# Patient Record
Sex: Female | Born: 1955 | Race: White | Hispanic: No | Smoking: Current every day smoker
Health system: Southern US, Community
[De-identification: ages and names within clinical notes are randomized; demographics above are authoritative.]

## PROBLEM LIST (undated history)

## (undated) DIAGNOSIS — K219 Gastro-esophageal reflux disease without esophagitis: Secondary | ICD-10-CM

## (undated) DIAGNOSIS — I82409 Acute embolism and thrombosis of unspecified deep veins of unspecified lower extremity: Secondary | ICD-10-CM

## (undated) DIAGNOSIS — I10 Essential (primary) hypertension: Secondary | ICD-10-CM

## (undated) DIAGNOSIS — D649 Anemia, unspecified: Secondary | ICD-10-CM

## (undated) DIAGNOSIS — I959 Hypotension, unspecified: Secondary | ICD-10-CM

## (undated) DIAGNOSIS — F419 Anxiety disorder, unspecified: Secondary | ICD-10-CM

## (undated) DIAGNOSIS — M1990 Unspecified osteoarthritis, unspecified site: Secondary | ICD-10-CM

## (undated) DIAGNOSIS — R51 Headache: Secondary | ICD-10-CM

## (undated) DIAGNOSIS — Z862 Personal history of diseases of the blood and blood-forming organs and certain disorders involving the immune mechanism: Secondary | ICD-10-CM

## (undated) DIAGNOSIS — G4730 Sleep apnea, unspecified: Secondary | ICD-10-CM

## (undated) DIAGNOSIS — IMO0001 Reserved for inherently not codable concepts without codable children: Secondary | ICD-10-CM

## (undated) DIAGNOSIS — Z86711 Personal history of pulmonary embolism: Secondary | ICD-10-CM

## (undated) DIAGNOSIS — R42 Dizziness and giddiness: Secondary | ICD-10-CM

## (undated) DIAGNOSIS — K859 Acute pancreatitis without necrosis or infection, unspecified: Secondary | ICD-10-CM

## (undated) DIAGNOSIS — F32A Depression, unspecified: Secondary | ICD-10-CM

## (undated) DIAGNOSIS — F329 Major depressive disorder, single episode, unspecified: Secondary | ICD-10-CM

## (undated) DIAGNOSIS — E119 Type 2 diabetes mellitus without complications: Secondary | ICD-10-CM

## (undated) DIAGNOSIS — R519 Headache, unspecified: Secondary | ICD-10-CM

## (undated) DIAGNOSIS — M5030 Other cervical disc degeneration, unspecified cervical region: Secondary | ICD-10-CM

## (undated) HISTORY — PX: ABDOMINAL HYSTERECTOMY: SHX81

## (undated) HISTORY — PX: JOINT REPLACEMENT: SHX530

## (undated) HISTORY — PX: DILATION AND CURETTAGE OF UTERUS: SHX78

---

## 2003-03-29 DIAGNOSIS — Z86711 Personal history of pulmonary embolism: Secondary | ICD-10-CM

## 2003-03-29 HISTORY — DX: Personal history of pulmonary embolism: Z86.711

## 2003-06-04 ENCOUNTER — Other Ambulatory Visit: Payer: Self-pay

## 2004-01-20 ENCOUNTER — Ambulatory Visit: Payer: Self-pay | Admitting: Physician Assistant

## 2004-02-18 ENCOUNTER — Ambulatory Visit: Payer: Self-pay | Admitting: Physician Assistant

## 2004-03-23 ENCOUNTER — Ambulatory Visit: Payer: Self-pay | Admitting: Physician Assistant

## 2004-03-25 ENCOUNTER — Ambulatory Visit: Payer: Self-pay | Admitting: Pain Medicine

## 2004-03-26 ENCOUNTER — Ambulatory Visit: Payer: Self-pay | Admitting: Pain Medicine

## 2004-04-21 ENCOUNTER — Ambulatory Visit: Payer: Self-pay | Admitting: Physician Assistant

## 2004-06-01 ENCOUNTER — Ambulatory Visit: Payer: Self-pay | Admitting: Physician Assistant

## 2004-06-30 ENCOUNTER — Ambulatory Visit: Payer: Self-pay | Admitting: Physician Assistant

## 2004-08-02 ENCOUNTER — Ambulatory Visit: Payer: Self-pay | Admitting: Physician Assistant

## 2004-09-06 ENCOUNTER — Ambulatory Visit: Payer: Self-pay | Admitting: Physician Assistant

## 2004-10-04 ENCOUNTER — Ambulatory Visit: Payer: Self-pay | Admitting: Physician Assistant

## 2004-11-03 ENCOUNTER — Ambulatory Visit: Payer: Self-pay | Admitting: Physician Assistant

## 2004-12-02 ENCOUNTER — Ambulatory Visit: Payer: Self-pay | Admitting: Physician Assistant

## 2004-12-31 ENCOUNTER — Ambulatory Visit: Payer: Self-pay | Admitting: Physician Assistant

## 2005-01-30 ENCOUNTER — Emergency Department (HOSPITAL_COMMUNITY): Admission: EM | Admit: 2005-01-30 | Discharge: 2005-01-30 | Payer: Self-pay | Admitting: Emergency Medicine

## 2005-02-02 ENCOUNTER — Ambulatory Visit: Payer: Self-pay | Admitting: Physician Assistant

## 2005-02-09 ENCOUNTER — Ambulatory Visit: Payer: Self-pay | Admitting: Physician Assistant

## 2005-03-02 ENCOUNTER — Ambulatory Visit: Payer: Self-pay | Admitting: Physician Assistant

## 2005-04-08 ENCOUNTER — Ambulatory Visit: Payer: Self-pay | Admitting: Physician Assistant

## 2005-05-09 ENCOUNTER — Ambulatory Visit: Payer: Self-pay | Admitting: Physician Assistant

## 2005-05-27 ENCOUNTER — Ambulatory Visit: Payer: Self-pay | Admitting: Neurology

## 2005-06-13 ENCOUNTER — Ambulatory Visit: Payer: Self-pay | Admitting: Physician Assistant

## 2005-07-18 ENCOUNTER — Ambulatory Visit: Payer: Self-pay | Admitting: Physician Assistant

## 2005-07-28 ENCOUNTER — Ambulatory Visit: Payer: Self-pay | Admitting: Orthopaedic Surgery

## 2005-08-08 ENCOUNTER — Other Ambulatory Visit: Payer: Self-pay

## 2005-08-11 ENCOUNTER — Ambulatory Visit: Payer: Self-pay | Admitting: Physician Assistant

## 2005-08-12 ENCOUNTER — Ambulatory Visit: Payer: Self-pay | Admitting: Orthopaedic Surgery

## 2005-09-20 ENCOUNTER — Ambulatory Visit: Payer: Self-pay | Admitting: Physician Assistant

## 2005-10-18 ENCOUNTER — Ambulatory Visit: Payer: Self-pay | Admitting: Physician Assistant

## 2005-10-26 ENCOUNTER — Ambulatory Visit: Payer: Self-pay | Admitting: Pain Medicine

## 2005-10-27 ENCOUNTER — Ambulatory Visit: Payer: Self-pay | Admitting: Pain Medicine

## 2005-11-17 ENCOUNTER — Ambulatory Visit: Payer: Self-pay | Admitting: Physician Assistant

## 2005-12-15 ENCOUNTER — Ambulatory Visit: Payer: Self-pay | Admitting: Physician Assistant

## 2005-12-27 ENCOUNTER — Ambulatory Visit: Payer: Self-pay | Admitting: Physician Assistant

## 2006-01-19 ENCOUNTER — Ambulatory Visit: Payer: Self-pay | Admitting: Physician Assistant

## 2006-01-25 ENCOUNTER — Ambulatory Visit: Payer: Self-pay | Admitting: Physician Assistant

## 2006-02-23 ENCOUNTER — Ambulatory Visit: Payer: Self-pay | Admitting: Physician Assistant

## 2006-03-30 ENCOUNTER — Ambulatory Visit: Payer: Self-pay | Admitting: Physician Assistant

## 2006-05-05 ENCOUNTER — Ambulatory Visit: Payer: Self-pay | Admitting: Physician Assistant

## 2006-05-31 ENCOUNTER — Ambulatory Visit: Payer: Self-pay | Admitting: Physician Assistant

## 2006-06-28 ENCOUNTER — Ambulatory Visit: Payer: Self-pay | Admitting: Physician Assistant

## 2006-07-26 ENCOUNTER — Ambulatory Visit: Payer: Self-pay | Admitting: Physician Assistant

## 2006-07-27 ENCOUNTER — Ambulatory Visit: Payer: Self-pay | Admitting: Pain Medicine

## 2006-08-31 ENCOUNTER — Ambulatory Visit: Payer: Self-pay | Admitting: Physician Assistant

## 2006-10-09 ENCOUNTER — Ambulatory Visit: Payer: Self-pay | Admitting: Physician Assistant

## 2006-11-07 ENCOUNTER — Ambulatory Visit: Payer: Self-pay | Admitting: Physician Assistant

## 2006-12-04 ENCOUNTER — Ambulatory Visit: Payer: Self-pay | Admitting: Physician Assistant

## 2007-01-03 ENCOUNTER — Ambulatory Visit: Payer: Self-pay | Admitting: Physician Assistant

## 2007-01-11 ENCOUNTER — Ambulatory Visit: Payer: Self-pay | Admitting: Physician Assistant

## 2007-01-22 ENCOUNTER — Ambulatory Visit: Payer: Self-pay | Admitting: Physician Assistant

## 2007-02-19 ENCOUNTER — Ambulatory Visit: Payer: Self-pay | Admitting: Physician Assistant

## 2007-03-19 ENCOUNTER — Ambulatory Visit: Payer: Self-pay | Admitting: Physician Assistant

## 2007-03-27 ENCOUNTER — Ambulatory Visit: Payer: Self-pay | Admitting: Physician Assistant

## 2007-04-23 ENCOUNTER — Ambulatory Visit: Payer: Self-pay | Admitting: Physician Assistant

## 2007-05-21 ENCOUNTER — Ambulatory Visit: Payer: Self-pay | Admitting: Physician Assistant

## 2007-06-12 ENCOUNTER — Ambulatory Visit: Payer: Self-pay | Admitting: Physician Assistant

## 2007-07-01 ENCOUNTER — Emergency Department: Payer: Self-pay | Admitting: Emergency Medicine

## 2007-07-24 ENCOUNTER — Ambulatory Visit: Payer: Self-pay | Admitting: Physician Assistant

## 2007-08-30 ENCOUNTER — Ambulatory Visit: Payer: Self-pay | Admitting: Physician Assistant

## 2007-09-27 ENCOUNTER — Ambulatory Visit: Payer: Self-pay | Admitting: Physician Assistant

## 2007-10-25 ENCOUNTER — Ambulatory Visit: Payer: Self-pay | Admitting: Physician Assistant

## 2007-11-08 ENCOUNTER — Ambulatory Visit: Payer: Self-pay | Admitting: Physician Assistant

## 2007-11-13 ENCOUNTER — Emergency Department: Payer: Self-pay

## 2007-11-15 ENCOUNTER — Ambulatory Visit: Payer: Self-pay | Admitting: Physician Assistant

## 2007-11-29 ENCOUNTER — Ambulatory Visit: Payer: Self-pay | Admitting: Pain Medicine

## 2007-12-10 ENCOUNTER — Ambulatory Visit: Payer: Self-pay | Admitting: Pain Medicine

## 2007-12-20 ENCOUNTER — Ambulatory Visit: Payer: Self-pay | Admitting: Physician Assistant

## 2008-02-06 ENCOUNTER — Encounter: Payer: Self-pay | Admitting: Physician Assistant

## 2008-02-28 ENCOUNTER — Ambulatory Visit: Payer: Self-pay | Admitting: Physician Assistant

## 2008-04-29 ENCOUNTER — Ambulatory Visit: Payer: Self-pay | Admitting: Physician Assistant

## 2008-06-26 ENCOUNTER — Ambulatory Visit: Payer: Self-pay | Admitting: Physician Assistant

## 2008-08-21 ENCOUNTER — Ambulatory Visit: Payer: Self-pay | Admitting: Physician Assistant

## 2008-09-19 ENCOUNTER — Ambulatory Visit: Payer: Self-pay | Admitting: Pain Medicine

## 2008-11-25 ENCOUNTER — Ambulatory Visit: Payer: Self-pay | Admitting: Physician Assistant

## 2008-12-29 ENCOUNTER — Ambulatory Visit: Payer: Self-pay | Admitting: Pain Medicine

## 2008-12-30 ENCOUNTER — Ambulatory Visit: Payer: Self-pay | Admitting: Physician Assistant

## 2009-01-12 ENCOUNTER — Ambulatory Visit: Payer: Self-pay | Admitting: Pain Medicine

## 2009-02-10 ENCOUNTER — Ambulatory Visit: Payer: Self-pay | Admitting: Pain Medicine

## 2009-03-16 ENCOUNTER — Ambulatory Visit: Payer: Self-pay | Admitting: Physician Assistant

## 2009-04-21 ENCOUNTER — Ambulatory Visit: Payer: Self-pay | Admitting: Physician Assistant

## 2009-05-11 ENCOUNTER — Ambulatory Visit: Payer: Self-pay

## 2009-07-02 ENCOUNTER — Ambulatory Visit: Payer: Self-pay | Admitting: Pain Medicine

## 2009-07-13 ENCOUNTER — Ambulatory Visit: Payer: Self-pay | Admitting: Unknown Physician Specialty

## 2009-07-16 ENCOUNTER — Ambulatory Visit: Payer: Self-pay | Admitting: Vascular Surgery

## 2009-07-21 ENCOUNTER — Inpatient Hospital Stay: Payer: Self-pay | Admitting: Unknown Physician Specialty

## 2009-07-24 ENCOUNTER — Encounter: Payer: Self-pay | Admitting: Internal Medicine

## 2009-07-26 ENCOUNTER — Encounter: Payer: Self-pay | Admitting: Internal Medicine

## 2009-09-15 ENCOUNTER — Ambulatory Visit: Payer: Self-pay | Admitting: Vascular Surgery

## 2009-11-24 ENCOUNTER — Inpatient Hospital Stay: Payer: Self-pay | Admitting: Internal Medicine

## 2009-12-24 ENCOUNTER — Ambulatory Visit: Payer: Self-pay | Admitting: Pain Medicine

## 2010-04-21 ENCOUNTER — Ambulatory Visit: Payer: Self-pay | Admitting: Pain Medicine

## 2010-07-08 ENCOUNTER — Encounter: Payer: Self-pay | Admitting: Anesthesiology

## 2010-08-13 ENCOUNTER — Ambulatory Visit: Payer: Self-pay | Admitting: Unknown Physician Specialty

## 2011-02-10 ENCOUNTER — Other Ambulatory Visit: Payer: Self-pay | Admitting: Rheumatology

## 2012-02-21 DIAGNOSIS — D6861 Antiphospholipid syndrome: Secondary | ICD-10-CM | POA: Diagnosis present

## 2013-03-28 DIAGNOSIS — K859 Acute pancreatitis without necrosis or infection, unspecified: Secondary | ICD-10-CM

## 2013-03-28 HISTORY — DX: Acute pancreatitis without necrosis or infection, unspecified: K85.90

## 2013-04-08 ENCOUNTER — Emergency Department: Payer: Self-pay | Admitting: Emergency Medicine

## 2014-11-06 ENCOUNTER — Encounter
Admission: RE | Admit: 2014-11-06 | Discharge: 2014-11-06 | Disposition: A | Discharge status: Home / Self Care | Payer: Medicare Other | Source: Ambulatory Visit | Attending: Unknown Physician Specialty | Admitting: Unknown Physician Specialty

## 2014-11-06 DIAGNOSIS — Z01812 Encounter for preprocedural laboratory examination: Secondary | ICD-10-CM | POA: Diagnosis present

## 2014-11-06 DIAGNOSIS — Z01810 Encounter for preprocedural cardiovascular examination: Secondary | ICD-10-CM | POA: Insufficient documentation

## 2014-11-06 HISTORY — DX: Personal history of pulmonary embolism: Z86.711

## 2014-11-06 HISTORY — DX: Unspecified osteoarthritis, unspecified site: M19.90

## 2014-11-06 HISTORY — DX: Anxiety disorder, unspecified: F41.9

## 2014-11-06 HISTORY — DX: Depression, unspecified: F32.A

## 2014-11-06 HISTORY — DX: Gastro-esophageal reflux disease without esophagitis: K21.9

## 2014-11-06 HISTORY — DX: Personal history of diseases of the blood and blood-forming organs and certain disorders involving the immune mechanism: Z86.2

## 2014-11-06 HISTORY — DX: Dizziness and giddiness: R42

## 2014-11-06 HISTORY — DX: Anemia, unspecified: D64.9

## 2014-11-06 HISTORY — DX: Reserved for inherently not codable concepts without codable children: IMO0001

## 2014-11-06 HISTORY — DX: Major depressive disorder, single episode, unspecified: F32.9

## 2014-11-06 HISTORY — DX: Acute pancreatitis without necrosis or infection, unspecified: K85.90

## 2014-11-06 HISTORY — DX: Headache: R51

## 2014-11-06 HISTORY — DX: Headache, unspecified: R51.9

## 2014-11-06 HISTORY — DX: Type 2 diabetes mellitus without complications: E11.9

## 2014-11-06 HISTORY — DX: Sleep apnea, unspecified: G47.30

## 2014-11-06 HISTORY — DX: Other cervical disc degeneration, unspecified cervical region: M50.30

## 2014-11-06 LAB — BASIC METABOLIC PANEL
ANION GAP: 7 (ref 5–15)
BUN: 13 mg/dL (ref 6–20)
CO2: 25 mmol/L (ref 22–32)
Calcium: 8.7 mg/dL — ABNORMAL LOW (ref 8.9–10.3)
Chloride: 101 mmol/L (ref 101–111)
Creatinine, Ser: 0.88 mg/dL (ref 0.44–1.00)
GFR calc non Af Amer: >60 mL/min (ref >60)
Glucose, Bld: 104 mg/dL — ABNORMAL HIGH (ref 65–99)
Potassium: 3.7 mmol/L (ref 3.5–5.1)
Sodium: 133 mmol/L — ABNORMAL LOW (ref 135–145)

## 2014-11-06 LAB — DIFFERENTIAL
Basophils Absolute: 0 10*3/uL (ref 0–0.1)
Basophils Relative: 1 %
Eosinophils Absolute: 0.1 10*3/uL (ref 0–0.7)
Eosinophils Relative: 2 %
Lymphocytes Relative: 20 %
Lymphs Abs: 1.3 10*3/uL (ref 1.0–3.6)
Monocytes Absolute: 0.5 10*3/uL (ref 0.2–0.9)
Monocytes Relative: 8 %
NEUTROS PCT: 69 %
Neutro Abs: 4.7 10*3/uL (ref 1.4–6.5)

## 2014-11-06 LAB — CBC
HCT: 25.8 % — ABNORMAL LOW (ref 35.0–47.0)
Hemoglobin: 8.5 g/dL — ABNORMAL LOW (ref 12.0–16.0)
MCH: 24.6 pg — AB (ref 26.0–34.0)
MCHC: 33 g/dL (ref 32.0–36.0)
MCV: 74.7 fL — AB (ref 80.0–100.0)
Platelets: 253 10*3/uL (ref 150–440)
RBC: 3.45 MIL/uL — ABNORMAL LOW (ref 3.80–5.20)
RDW: 21.1 % — ABNORMAL HIGH (ref 11.5–14.5)
WBC: 6.7 10*3/uL (ref 3.6–11.0)

## 2014-11-06 NOTE — Patient Instructions (Signed)
  Your procedure is scheduled on: November 11, 2014 (Tuesday) Report to Day Surgery. To find out your arrival time please call [phone] between 1PM - 3PM on August 15,2016 (Monday).  Remember: Instructions that are not followed completely may result in serious medical risk, up to and including death, or upon the discretion of your surgeon and anesthesiologist your surgery may need to be rescheduled.    __x__ 1. Do not eat food or drink liquids after midnight. No gum chewing or hard candies.     ____ 2. No Alcohol for 24 hours before or after surgery.   ____ 3. Bring all medications with you on the day of surgery if instructed.    __x__ 4. Notify your doctor if there is any change in your medical condition     (cold, fever, infections).     Do not wear jewelry, make-up, hairpins, clips or nail polish.  Do not wear lotions, powders, or perfumes. You may wear deodorant.  Do not shave 48 hours prior to surgery. Men may shave face and neck.  Do not bring valuables to the hospital.    [facility] is not responsible for any belongings or valuables.               Contacts, dentures or bridgework may not be worn into surgery.  Leave your suitcase in the car. After surgery it may be brought to your room.  For patients admitted to the hospital, discharge time is determined by your                treatment team.   Patients discharged the day of surgery will not be allowed to drive home.   Please read over the following fact sheets that you were given: Return on Monday, August 15, to have PT/INR drawn     __x__ Take these medicines the morning of surgery with A SIP OF WATER:    1. Famotidine  2. Prozac  3.   4.  5.  6.  ____ Fleet Enema (as directed)   ____ Use CHG Soap as directed  ____ Use inhalers on the day of surgery  _x___ Stop metformin 2 days prior to surgery (STOP METFORMIN ON AUGUST 14)  _x__ Take 1/2 of usual insulin dose the night before surgery and none on the  morning of surgery.   __x__ Stop Coumadin/Plavix/aspirin on (STOP COUMADIN FIVE DAYS PRIOR TO SURGERY AND START LOVENOX AS INSTRUCTED)  ____ Stop Anti-inflammatories on    ____ Stop supplements until after surgery.    _x___ Bring C-Pap to the hospital.

## 2014-11-10 ENCOUNTER — Other Ambulatory Visit: Payer: Medicare Other

## 2014-11-10 ENCOUNTER — Encounter: Payer: Self-pay | Admitting: *Deleted

## 2014-11-11 ENCOUNTER — Ambulatory Visit
Admission: RE | Admit: 2014-11-11 | Payer: Medicare Other | Source: Ambulatory Visit | Admitting: Unknown Physician Specialty

## 2014-11-11 ENCOUNTER — Encounter: Admission: RE | Payer: Self-pay | Source: Ambulatory Visit

## 2014-11-11 HISTORY — DX: Acute embolism and thrombosis of unspecified deep veins of unspecified lower extremity: I82.409

## 2014-11-11 SURGERY — SEPTOPLASTY, NOSE, WITH NASAL TURBINATE REDUCTION
Anesthesia: Choice | Laterality: Bilateral

## 2014-12-15 ENCOUNTER — Encounter
Admission: RE | Admit: 2014-12-15 | Discharge: 2014-12-15 | Disposition: A | Discharge status: Home / Self Care | Payer: Medicare Other | Source: Ambulatory Visit | Attending: Unknown Physician Specialty | Admitting: Unknown Physician Specialty

## 2014-12-15 DIAGNOSIS — G4730 Sleep apnea, unspecified: Secondary | ICD-10-CM | POA: Diagnosis not present

## 2014-12-15 DIAGNOSIS — E119 Type 2 diabetes mellitus without complications: Secondary | ICD-10-CM | POA: Diagnosis not present

## 2014-12-15 DIAGNOSIS — F17200 Nicotine dependence, unspecified, uncomplicated: Secondary | ICD-10-CM | POA: Diagnosis not present

## 2014-12-15 DIAGNOSIS — K219 Gastro-esophageal reflux disease without esophagitis: Secondary | ICD-10-CM | POA: Diagnosis not present

## 2014-12-15 DIAGNOSIS — J3489 Other specified disorders of nose and nasal sinuses: Secondary | ICD-10-CM | POA: Diagnosis not present

## 2014-12-15 LAB — DIFFERENTIAL
Basophils Absolute: 0.1 10*3/uL (ref 0–0.1)
Basophils Relative: 1 %
EOS PCT: 0 %
Eosinophils Absolute: 0 10*3/uL (ref 0–0.7)
LYMPHS ABS: 1.2 10*3/uL (ref 1.0–3.6)
LYMPHS PCT: 14 %
Monocytes Absolute: 0.9 10*3/uL (ref 0.2–0.9)
Monocytes Relative: 10 %
NEUTROS ABS: 6.6 10*3/uL — AB (ref 1.4–6.5)
NEUTROS PCT: 75 %

## 2014-12-15 LAB — CBC
HCT: 28.8 % — ABNORMAL LOW (ref 35.0–47.0)
HEMOGLOBIN: 9.8 g/dL — AB (ref 12.0–16.0)
MCH: 25.6 pg — AB (ref 26.0–34.0)
MCHC: 34 g/dL (ref 32.0–36.0)
MCV: 75.3 fL — AB (ref 80.0–100.0)
Platelets: 246 10*3/uL (ref 150–440)
RBC: 3.82 MIL/uL (ref 3.80–5.20)
RDW: 21.5 % — ABNORMAL HIGH (ref 11.5–14.5)
WBC: 8.7 10*3/uL (ref 3.6–11.0)

## 2014-12-15 LAB — PROTIME-INR
INR: 1.02
Prothrombin Time: 13.6 seconds (ref 11.4–15.0)

## 2014-12-16 ENCOUNTER — Ambulatory Visit
Admission: RE | Admit: 2014-12-16 | Discharge: 2014-12-16 | Disposition: A | Discharge status: Home / Self Care | Payer: Medicare Other | Source: Ambulatory Visit | Attending: Unknown Physician Specialty | Admitting: Unknown Physician Specialty

## 2014-12-16 ENCOUNTER — Ambulatory Visit: Payer: Medicare Other | Admitting: Anesthesiology

## 2014-12-16 ENCOUNTER — Encounter: Payer: Self-pay | Admitting: *Deleted

## 2014-12-16 ENCOUNTER — Encounter
Admission: RE | Disposition: A | Discharge status: Home / Self Care | Payer: Self-pay | Source: Ambulatory Visit | Attending: Unknown Physician Specialty

## 2014-12-16 DIAGNOSIS — F17200 Nicotine dependence, unspecified, uncomplicated: Secondary | ICD-10-CM | POA: Insufficient documentation

## 2014-12-16 DIAGNOSIS — G4730 Sleep apnea, unspecified: Secondary | ICD-10-CM | POA: Insufficient documentation

## 2014-12-16 DIAGNOSIS — K219 Gastro-esophageal reflux disease without esophagitis: Secondary | ICD-10-CM | POA: Insufficient documentation

## 2014-12-16 DIAGNOSIS — J3489 Other specified disorders of nose and nasal sinuses: Secondary | ICD-10-CM | POA: Diagnosis not present

## 2014-12-16 DIAGNOSIS — E119 Type 2 diabetes mellitus without complications: Secondary | ICD-10-CM | POA: Insufficient documentation

## 2014-12-16 HISTORY — PX: NASAL SEPTOPLASTY W/ TURBINOPLASTY: SHX2070

## 2014-12-16 LAB — GLUCOSE, CAPILLARY
GLUCOSE-CAPILLARY: 115 mg/dL — AB (ref 65–99)
GLUCOSE-CAPILLARY: 136 mg/dL — AB (ref 65–99)

## 2014-12-16 SURGERY — SEPTOPLASTY, NOSE, WITH NASAL TURBINATE REDUCTION
Anesthesia: General | Laterality: Bilateral | Wound class: Clean Contaminated

## 2014-12-16 MED ORDER — ROCURONIUM BROMIDE 100 MG/10ML IV SOLN
INTRAVENOUS | Status: DC | PRN
  Administered 2014-12-16: 40 mg via INTRAVENOUS
  Administered 2014-12-16: 10 mg via INTRAVENOUS

## 2014-12-16 MED ORDER — MIDAZOLAM HCL 2 MG/2ML IJ SOLN
INTRAMUSCULAR | Status: DC | PRN
  Administered 2014-12-16: 2 mg via INTRAVENOUS

## 2014-12-16 MED ORDER — ONDANSETRON HCL 4 MG/2ML IJ SOLN
INTRAMUSCULAR | Status: DC | PRN
  Administered 2014-12-16: 4 mg via INTRAVENOUS

## 2014-12-16 MED ORDER — FENTANYL CITRATE (PF) 100 MCG/2ML IJ SOLN
INTRAMUSCULAR | Status: DC | PRN
  Administered 2014-12-16: 100 ug via INTRAVENOUS

## 2014-12-16 MED ORDER — BACITRACIN ZINC 500 UNIT/GM EX OINT
TOPICAL_OINTMENT | CUTANEOUS | Status: AC
  Filled 2014-12-16: qty 28.35

## 2014-12-16 MED ORDER — OXYMETAZOLINE HCL 0.05 % NA SOLN
NASAL | Status: AC
  Administered 2014-12-16: 6 via NASAL
  Filled 2014-12-16: qty 15

## 2014-12-16 MED ORDER — LIDOCAINE-EPINEPHRINE 1 %-1:100000 IJ SOLN
INTRAMUSCULAR | Status: AC
  Filled 2014-12-16: qty 1

## 2014-12-16 MED ORDER — PROPOFOL 10 MG/ML IV BOLUS
INTRAVENOUS | Status: DC | PRN
  Administered 2014-12-16: 130 mg via INTRAVENOUS

## 2014-12-16 MED ORDER — FENTANYL CITRATE (PF) 100 MCG/2ML IJ SOLN
25.0000 ug | INTRAMUSCULAR | Status: DC | PRN
  Administered 2014-12-16: 25 ug via INTRAVENOUS

## 2014-12-16 MED ORDER — OXYMETAZOLINE HCL 0.05 % NA SOLN
6.0000 | Freq: Once | NASAL | Status: AC
  Administered 2014-12-16: 6 via NASAL

## 2014-12-16 MED ORDER — SUGAMMADEX SODIUM 200 MG/2ML IV SOLN
INTRAVENOUS | Status: DC | PRN
  Administered 2014-12-16: 204.2 mg via INTRAVENOUS

## 2014-12-16 MED ORDER — SODIUM CHLORIDE 0.9 % IV SOLN
INTRAVENOUS | Status: DC
  Administered 2014-12-16: 07:00:00 via INTRAVENOUS

## 2014-12-16 MED ORDER — DEXAMETHASONE SODIUM PHOSPHATE 4 MG/ML IJ SOLN
INTRAMUSCULAR | Status: DC | PRN
  Administered 2014-12-16: 10 mg via INTRAVENOUS

## 2014-12-16 MED ORDER — FENTANYL CITRATE (PF) 100 MCG/2ML IJ SOLN
INTRAMUSCULAR | Status: AC
  Administered 2014-12-16: 25 ug via INTRAVENOUS
  Filled 2014-12-16: qty 2

## 2014-12-16 MED ORDER — PHENYLEPHRINE HCL 10 % OP SOLN
OPHTHALMIC | Status: DC | PRN
  Administered 2014-12-16: 5 mL via NASAL

## 2014-12-16 MED ORDER — SULFAMETHOXAZOLE-TRIMETHOPRIM 400-80 MG PO TABS: 1.0000 | ORAL_TABLET | Freq: Two times a day (BID) | ORAL | Status: DC

## 2014-12-16 MED ORDER — ONDANSETRON HCL 4 MG/2ML IJ SOLN: 4.0000 mg | Freq: Once | INTRAMUSCULAR | Status: DC | PRN

## 2014-12-16 MED ORDER — PHENYLEPHRINE HCL 10 % OP SOLN
Freq: Once | OPHTHALMIC | Status: DC
  Filled 2014-12-16: qty 10

## 2014-12-16 MED ORDER — LIDOCAINE-EPINEPHRINE 1 %-1:100000 IJ SOLN
INTRAMUSCULAR | Status: DC | PRN
  Administered 2014-12-16: 10 mL

## 2014-12-16 SURGICAL SUPPLY — 25 items
AGENT HMST MTR 8 SURGIFLO (HEMOSTASIS)
BANDAGE EYE OVAL (MISCELLANEOUS) ×1 IMPLANT
BLADE SURG 15 STRL LF DISP TIS (BLADE) ×1 IMPLANT
BLADE SURG 15 STRL SS (BLADE) ×2
CANISTER SUCT 1200ML W/VALVE (MISCELLANEOUS) ×2 IMPLANT
COAG SUCT 10F 3.5MM HAND CTRL (MISCELLANEOUS) ×2 IMPLANT
DRESSING NASL FOAM PST OP SINU (MISCELLANEOUS) ×1 IMPLANT
DRSG NASAL FOAM POST OP SINU (MISCELLANEOUS) ×4
GLOVE BIO SURGEON STRL SZ7.5 (GLOVE) ×5 IMPLANT
GOWN STRL REUS W/ TWL LRG LVL3 (GOWN DISPOSABLE) ×2 IMPLANT
GOWN STRL REUS W/TWL LRG LVL3 (GOWN DISPOSABLE) ×4
LABEL OR SOLS (LABEL) ×2 IMPLANT
NS IRRIG 500ML POUR BTL (IV SOLUTION) ×3 IMPLANT
PACK HEAD/NECK (MISCELLANEOUS) ×2 IMPLANT
PAD GROUND ADULT SPLIT (MISCELLANEOUS) ×2 IMPLANT
SPLINT NASAL REUTER .5MM (MISCELLANEOUS) ×2 IMPLANT
SPOGE SURGIFLO 8M (HEMOSTASIS)
SPONGE NEURO XRAY DETECT 1X3 (DISPOSABLE) ×2 IMPLANT
SPONGE SURGIFLO 8M (HEMOSTASIS) ×1 IMPLANT
SUT CHROMIC 3-0 (SUTURE) ×2
SUT CHROMIC 3-0 KS 27XMFL CR (SUTURE) ×1
SUT ETHILON 3-0 KS 30 BLK (SUTURE) ×2 IMPLANT
SUT PLAIN GUT 4-0 (SUTURE) ×2 IMPLANT
SUTURE CHRMC 3-0 KS 27XMFL CR (SUTURE) ×1 IMPLANT
WATER STERILE IRR 1000ML POUR (IV SOLUTION) ×2 IMPLANT

## 2014-12-16 NOTE — Transfer of Care (Signed)
Immediate Anesthesia Transfer of Care Note  Patient: Cathy Fox  Procedure(s) Performed: Procedure(s): NASAL SEPTOPLASTY WITH BILATERAL TURBINATE REDUCTION (Bilateral)  Patient Location: PACU  Anesthesia Type:General  Level of Consciousness: awake, alert  and oriented  Airway & Oxygen Therapy: Patient Spontanous Breathing and Patient connected to face mask oxygen  Post-op Assessment: Report given to RN and Post -op Vital signs reviewed and stable  Post vital signs: Reviewed and stable  Last Vitals:  Filed Vitals:   12/16/14 0818  BP: 149/75  Pulse: 80  Temp: 36.8 C  Resp: 14    Complications: No apparent anesthesia complications

## 2014-12-16 NOTE — Op Note (Signed)
PREOPERATIVE DIAGNOSIS:  Chronic nasal obstruction.  POSTOPERATIVE DIAGNOSIS:  Chronic nasal obstruction.  SURGEON:  Davina Poke, M.D.  NAME OF PROCEDURE:  1. Nasal septoplasty. 2. Cautery and outfracture of  inferior turbinates.  OPERATIVE FINDINGS:  Severe nasal septal deformity, hypertrophy of the inferior turbinates.   DESCRIPTION OF THE PROCEDURE:  Cathy Fox was identified in the holding area and taken to the operating room and placed in the supine position.  After general endotracheal anesthesia was induced, the table was turned 45 degrees and the patient was placed in a semi-Fowler position.  The nose was then topically anesthetized with Lidocaine, cotton pledgets were placed within each nostril. After approximately 5 minutes, this was removed at which time a local anesthetic of 1% Lidocaine 1:100,000 units of Epinephrine was used to inject the inferior turbinates in the nasal septum. A total of 10 ml was used. Examination of the nose showed a severe left nasal septal deformity and tremendous hypertrophied inferior turbinate.  Beginning on the right hand side a hemitransfixion incision was then created on the leading edge of the septum on the right.  A subperichondrial plane was elevated posteriorly on the left and taken back to the perpendicular plate of the ethmoid where subperiosteal plane was elevated posteriorly on the left. A large septal spur was identified on the left hand side impacting on the inferior turbinate.  An inferior rim of cartilage was removed anteriorly with care taken to leave an anterior strut to prevent nasal collapse. With this strut removed the perpendicular plate of the ethmoid was separated from the quadrangular cartilage. The large septal spur was removed.  The septum was then replaced in the midline. Reinspection through each nostril showed excellent reduction of the septal deformity. A left posterior inferior fenestration was then created to allow hematoma  drainage.  With the septoplasty completed, beginning on the left-hand side, the suction cautery was used to shrink the mucosa of the inferior turbinate.  With this cauterized, the inferior turbinate was outfracture.  In similar fashion the inferior turbinate on the right.  With the cautery and outfracture completed bilaterally and no active bleeding, the hemitransfixion incision was then closed using two interrupted 3-0 chromic sutures. A 5-0 plain gut on a keith needle was used in a whipstitch fashion to reapproximate the setpal flaps.  Stammberger was then used beneath each inferior turbinate for hemostasis.    The patient tolerated the procedure well, was returned to anesthesia, extubated in the operating room, and taken to the recovery room in stable condition.    CULTURES:  None.  SPECIMENS:  None.  ESTIMATED BLOOD LOSS:  25 cc.  MCQUEEN,CHAPMAN T  12/16/2014  8:08 AM

## 2014-12-16 NOTE — Anesthesia Preprocedure Evaluation (Signed)
Anesthesia Evaluation  Patient identified by MRN, date of birth, ID band Patient awake    Reviewed: Allergy & Precautions, NPO status , Patient's Chart, lab work & pertinent test results  History of Anesthesia Complications Negative for: history of anesthetic complications  Airway Mallampati: II  TM Distance: >3 FB Neck ROM: Full    Dental  (+) Upper Dentures, Lower Dentures   Pulmonary sleep apnea (using only occ) and Continuous Positive Airway Pressure Ventilation , Current Smoker (using e-cigs),           Cardiovascular negative cardio ROS       Neuro/Psych Anxiety Depression    GI/Hepatic Neg liver ROS, GERD  Medicated,  Endo/Other  diabetes, Type 2, Oral Hypoglycemic Agents, Insulin Dependent  Renal/GU negative Renal ROS     Musculoskeletal   Abdominal   Peds  Hematology  (+) anemia ,   Anesthesia Other Findings   Reproductive/Obstetrics                             Anesthesia Physical Anesthesia Plan  ASA: III  Anesthesia Plan: General   Post-op Pain Management:    Induction: Intravenous  Airway Management Planned: Oral ETT  Additional Equipment:   Intra-op Plan:   Post-operative Plan:   Informed Consent: I have reviewed the patients History and Physical, chart, labs and discussed the procedure including the risks, benefits and alternatives for the proposed anesthesia with the patient or authorized representative who has indicated his/her understanding and acceptance.     Plan Discussed with:   Anesthesia Plan Comments:         Anesthesia Quick Evaluation

## 2014-12-16 NOTE — H&P (Signed)
  H+P  Reviewed and will be scanned in later. No changes noted.H+P  Reviewed and will be scanned in later. No changes noted.

## 2014-12-16 NOTE — Discharge Instructions (Signed)

## 2014-12-16 NOTE — Anesthesia Postprocedure Evaluation (Signed)
  Anesthesia Post-op Note  Patient: Cathy Fox  Procedure(s) Performed: Procedure(s): NASAL SEPTOPLASTY WITH BILATERAL TURBINATE REDUCTION (Bilateral)  Anesthesia type:General  Patient location: PACU  Post pain: Pain level controlled  Post assessment: Post-op Vital signs reviewed, Patient's Cardiovascular Status Stable, Respiratory Function Stable, Patent Airway and No signs of Nausea or vomiting  Post vital signs: Reviewed and stable  Last Vitals:  Filed Vitals:   12/16/14 0833  BP: 135/72  Pulse: 73  Temp:   Resp: 14    Level of consciousness: awake, alert  and patient cooperative  Complications: No apparent anesthesia complications

## 2014-12-16 NOTE — Anesthesia Procedure Notes (Signed)
Procedure Name: Intubation Date/Time: 12/16/2014 7:24 AM Performed by: Omer Jack Pre-anesthesia Checklist: Patient identified, Patient being monitored, Timeout performed, Emergency Drugs available and Suction available Patient Re-evaluated:Patient Re-evaluated prior to inductionOxygen Delivery Method: Circle system utilized Preoxygenation: Pre-oxygenation with 100% oxygen Intubation Type: IV induction Ventilation: Mask ventilation without difficulty Laryngoscope Size: Mac and 3 Grade View: Grade I Tube type: Oral Rae Tube size: 7.0 mm Number of attempts: 1 Placement Confirmation: ETT inserted through vocal cords under direct vision,  positive ETCO2 and breath sounds checked- equal and bilateral Secured at: 21 cm Tube secured with: Tape Dental Injury: Teeth and Oropharynx as per pre-operative assessment

## 2015-01-02 ENCOUNTER — Ambulatory Visit
Admission: RE | Admit: 2015-01-02 | Discharge: 2015-01-02 | Disposition: A | Discharge status: Home / Self Care | Payer: Medicare Other | Source: Ambulatory Visit | Attending: Unknown Physician Specialty | Admitting: Unknown Physician Specialty

## 2015-01-02 ENCOUNTER — Other Ambulatory Visit: Payer: Self-pay | Admitting: Unknown Physician Specialty

## 2015-01-02 DIAGNOSIS — R0602 Shortness of breath: Secondary | ICD-10-CM | POA: Diagnosis not present

## 2015-01-05 ENCOUNTER — Other Ambulatory Visit
Admission: RE | Admit: 2015-01-05 | Discharge: 2015-01-05 | Disposition: A | Discharge status: Home / Self Care | Payer: Medicare Other | Source: Ambulatory Visit | Attending: Unknown Physician Specialty | Admitting: Unknown Physician Specialty

## 2015-01-05 DIAGNOSIS — Z029 Encounter for administrative examinations, unspecified: Secondary | ICD-10-CM | POA: Insufficient documentation

## 2015-01-09 ENCOUNTER — Other Ambulatory Visit
Admission: RE | Admit: 2015-01-09 | Discharge: 2015-01-09 | Disposition: A | Discharge status: Home / Self Care | Payer: Medicare Other | Source: Ambulatory Visit | Attending: Unknown Physician Specialty | Admitting: Unknown Physician Specialty

## 2015-01-09 DIAGNOSIS — R05 Cough: Secondary | ICD-10-CM | POA: Insufficient documentation

## 2015-01-09 LAB — EXPECTORATED SPUTUM ASSESSMENT W GRAM STAIN, RFLX TO RESP C

## 2015-01-09 LAB — EXPECTORATED SPUTUM ASSESSMENT W REFEX TO RESP CULTURE

## 2015-01-22 ENCOUNTER — Institutional Professional Consult (permissible substitution): Payer: Medicare Other | Admitting: Internal Medicine

## 2015-03-02 ENCOUNTER — Institutional Professional Consult (permissible substitution): Payer: Medicare Other | Admitting: Internal Medicine

## 2015-04-09 ENCOUNTER — Ambulatory Visit
Admission: RE | Admit: 2015-04-09 | Discharge: 2015-04-09 | Disposition: A | Discharge status: Home / Self Care | Payer: Medicare Other | Source: Ambulatory Visit | Attending: Internal Medicine | Admitting: Internal Medicine

## 2015-04-09 ENCOUNTER — Ambulatory Visit (INDEPENDENT_AMBULATORY_CARE_PROVIDER_SITE_OTHER): Payer: Medicare Other | Admitting: Internal Medicine

## 2015-04-09 ENCOUNTER — Encounter: Payer: Self-pay | Admitting: Internal Medicine

## 2015-04-09 VITALS — BP 128/66 | HR 72 | Ht 65.0 in | Wt 218.0 lb

## 2015-04-09 DIAGNOSIS — M069 Rheumatoid arthritis, unspecified: Secondary | ICD-10-CM

## 2015-04-09 DIAGNOSIS — J438 Other emphysema: Secondary | ICD-10-CM

## 2015-04-09 DIAGNOSIS — G4733 Obstructive sleep apnea (adult) (pediatric): Secondary | ICD-10-CM | POA: Diagnosis not present

## 2015-04-09 MED ORDER — UMECLIDINIUM BROMIDE 62.5 MCG/INH IN AEPB: 1.0000 | INHALATION_SPRAY | Freq: Every day | RESPIRATORY_TRACT | Status: AC

## 2015-04-09 NOTE — Progress Notes (Signed)
[facility] Pulmonary Medicine Consultation      Assessment and Plan:  Chronic bronchitis.  -Chronic expectoration which is worsened at night is likely secondary to chronic bronchitis and COPD. -She is given a sample of Incruz inhaler to be used for the next week. She is asked to call us back in 1 week to let us know if it is helping. She is asked to continue taking her Mucinex at night. -She expressed that she is having trouble affording her medications. We discussed that we can try using a different inhaler. If this one is too expensive.   OSA.  -Continue using CPAP every night.  Nicotine Abuse.  -We discussed setting a quit date for smoking cessation, she already has Chantix at home. I asked her to start taking Chantix 1 tablet a day for the first week, starting a week before her quit date, subsequently she can start Chantix twice daily on her quit date and stop smoking. He cigarettes and regular cigarettes.  Rheumatoid arthritis. -This can contribute to pulmonary disease, will obtain a baseline chest x-ray.  History of pulmonary embolism and DVT. -Currently on Coumadin.   Date: 04/09/2015  MRN# [id number] Cathy Fox [birth year]  Referring Physician: Dr. Jenne Campus.   Cathy Fox is a 60 y.o. old female seen in consultation for chief complaint of:    Chief Complaint  Patient presents with  . PULMONARY CONSULT    pt. ref. by dr. Jenne Campus. pt. states when she lays down @ night it feels like something is filling up and makes a bubbling sound in her chest.  occ. SOB. dry cough. occ. wheezing. occ. chest pain/tightness.    HPI:   She tells me that she had a severe cold after repair of a deviated septum in September of 2016. The symptoms have been waxing and waning since that time. She has OSA and has noted that she has trouble sleeping because her chest feels that it is feeling up and her chest sounds like "bubbles", it is relieved by cough, then recurs in 15 minutes. She  occasionally brings up some phlegm. This predominantly occurs at night, but during the day as well.  When she wakes in the am she has cough but nothing comes up.  She has a dog and sleeps in bed with her.  She smokes occasionally, but otherwise she vapes about once per hour. She used to smoke regularly about 6 months ago, about 1 ppd at that time.   She thinks that she has been told that she has COPD, though she thinks that it has not been officially diagnosed.  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   Chest x-ray from 01/02/2015 images and report reviewed, changes of chronic bronchitis, no sig can change when compared to previous film from 11/24/2009.   PMHX:   Past Medical History  Diagnosis Date  . Diabetes mellitus without complication   . Arthritis   . DDD (degenerative disc disease), cervical   . Sleep apnea   . Shortness of breath dyspnea   . Depression   . Anxiety   . GERD (gastroesophageal reflux disease)   . Headache   . Anemia   . Pancreatitis [address], [place]  . Vertigo   . History of pulmonary embolus (PE) 2005  . DVT (deep venous thrombosis)   . Hx of antiphospholipid antibody syndrome    Surgical Hx:  Past Surgical History  Procedure Laterality Date  . Abdominal hysterectomy    . Joint replacement Left     Total knee Replacement  . Dilation and curettage of uterus    . Nasal septoplasty w/ turbinoplasty Bilateral 12/16/2014    Procedure: NASAL SEPTOPLASTY WITH BILATERAL TURBINATE REDUCTION;  Surgeon: Linus Salmons, MD;  Location: [facility] ORS;  Service: ENT;  Laterality: Bilateral;   Family Hx:  No family history on file. Social Hx:   Social History  Substance Use Topics  . Smoking status: Current Every Day Smoker -- 1.00 packs/day    Types: E-cigarettes  . Smokeless tobacco: Never Used  . Alcohol Use: No   Medication:   Current Outpatient Rx  Name  Route  Sig  Dispense  Refill  . busPIRone (BUSPAR) 30 MG tablet    Oral   Take 30 mg by mouth 3 (three) times daily.         Marland Kitchen enoxaparin (LOVENOX) 100 MG/ML injection   Subcutaneous   Inject 100 mg into the skin every 12 (twelve) hours.         . famotidine (PEPCID) 20 MG tablet   Oral   Take 20 mg by mouth 2 (two) times daily.         Marland Kitchen FLUoxetine (PROZAC) 20 MG capsule   Oral   Take 60 mg by mouth daily. Three tablets (60 mg) in the morning         . folic acid (FOLVITE) 1 MG tablet   Oral   Take 1 mg by mouth daily.         . furosemide (LASIX) 20 MG tablet   Oral   Take 40 mg by mouth every morning. 20 mg in the evening         . glipiZIDE (GLUCOTROL XL) 10 MG 24 hr tablet   Oral   Take 10 mg by mouth daily with breakfast.         . HYDROmorphone (DILAUDID) 4 MG tablet   Oral   Take 4 mg by mouth 3 (three) times daily as needed for severe pain.         Marland Kitchen insulin NPH Human (HUMULIN N,NOVOLIN N) 100 UNIT/ML injection   Subcutaneous   Inject 10 Units into the skin as needed. 10 units in the morning as needed and 15 units in the evening as needed for blood sugar > 150.         . metFORMIN (GLUCOPHAGE) 1000 MG tablet   Oral   Take 1,000 mg by mouth 2 (two) times daily with a meal.         . oxymorphone (OPANA ER) 30 MG 12 hr tablet   Oral   Take 30 mg by mouth every 12 (twelve) hours.         . potassium chloride (K-DUR,KLOR-CON) 10 MEQ tablet   Oral   Take 10 mEq by mouth daily.         . pregabalin (LYRICA) 100 MG capsule   Oral   Take 100 mg by mouth 2 (two) times daily. Two tablets (200 mg) in the morning, and one tablet (100 mg) in the evening         . sulfamethoxazole-trimethoprim (BACTRIM) 400-80 MG per tablet   Oral   Take 1 tablet by mouth 2 (two) times daily.   20 tablet   0   . SUMAtriptan (IMITREX) 50 MG tablet   Oral   Take 50 mg by mouth every 2 (two) hours as  needed for migraine. May repeat in 2 hours if headache persists or recurs.         . tizanidine (ZANAFLEX) 6 MG capsule    Oral   Take 6 mg by mouth 4 (four) times daily -  with meals and at bedtime.             Allergies:  Review of patient's allergies indicates no known allergies.  Review of Systems: Gen:  Denies  fever, sweats, chills HEENT: Denies blurred vision, double vision. bleeds, sore throat Cvc:  No dizziness, chest pain. Resp:   Denies cough or sputum porduction, shortness of breath Gi: Denies swallowing difficulty, stomach pain. Gu:  Denies bladder incontinence, burning urine Ext:   No Joint pain, stiffness. Skin: No skin rash,  hives Endoc:  No polyuria, polydipsia. Psych: No depression, insomnia. Other:  All other systems were reviewed with the patient and were negative other that what is mentioned in the HPI.   Physical Examination:   VS: BP 128/66 mmHg  Pulse 72  Ht 5\' 5"  (1.651 m)  Wt 218 lb (98.884 kg)  BMI 36.28 kg/m2  SpO2 96%  General Appearance: No distress  Neuro:without focal findings,  speech normal,  HEENT: PERRLA, EOM intact.   Pulmonary: normal breath sounds, No wheezing.  CardiovascularNormal S1,S2.  No m/r/g.   Abdomen: Benign, Soft, non-tender. Renal:  No costovertebral tenderness  GU:  No performed at this time. Endoc: No evident thyromegaly, no signs of acromegaly. Skin:   warm, no rashes, no ecchymosis  Extremities: normal, no cyanosis, clubbing.  Other findings:    LABORATORY PANEL:   CBC No results for input(s): WBC, HGB, HCT, PLT in the last 168 hours. ------------------------------------------------------------------------------------------------------------------  Chemistries  No results for input(s): NA, K, CL, CO2, GLUCOSE, BUN, CREATININE, CALCIUM, MG, AST, ALT, ALKPHOS, BILITOT in the last 168 hours.  Invalid input(s): GFRCGP ------------------------------------------------------------------------------------------------------------------  Cardiac Enzymes No results for input(s): TROPONINI in the last 168  hours. ------------------------------------------------------------  RADIOLOGY:  No results found.     Thank  you for the consultation and for allowing [facility] to assist in the care of your patient. Our recommendations are noted above.  Please contact [facility] if we can be of further service.   [place], MD.  Board Certified in Internal Medicine, Pulmonary Medicine, Critical Care Medicine, and Sleep Medicine.   Pulmonary and Critical Care   Wells Guiles, M.D.  Santiago Glad, M.D.  Stephanie Acre, M.D

## 2015-04-09 NOTE — Addendum Note (Signed)
Addended by: Maxwell Marion A on: 04/09/2015 09:52 AM   Modules accepted: Orders

## 2015-04-09 NOTE — Patient Instructions (Signed)
PFT, CXR 2-view.   --Start incruz once every night. Let us know if it helps, and if the medicine is too expensive and we can try another one.  --continue mucinex daily.  --Stop using electronic cigarettes.  --move pets out of bedroom.

## 2015-04-10 ENCOUNTER — Telehealth: Payer: Self-pay

## 2015-04-10 NOTE — Telephone Encounter (Signed)
Pt would like results from chest xray  Yesterday. Please call.

## 2015-04-10 NOTE — Telephone Encounter (Signed)
Informed pt I would forward message to DR for results of CXR.

## 2015-04-14 NOTE — Telephone Encounter (Signed)
Pt informed. Nothing further needed. 

## 2015-04-14 NOTE — Telephone Encounter (Signed)
Her CXR looks normal with expected age related changes.

## 2015-04-14 NOTE — Telephone Encounter (Signed)
Please advise on CXR results. 

## 2015-04-14 NOTE — Telephone Encounter (Signed)
Pt would like results from chest xray She did 04/09/15  She stated we can talk to her husband if she is not there.

## 2015-04-15 ENCOUNTER — Telehealth: Payer: Self-pay

## 2015-04-15 MED ORDER — UMECLIDINIUM BROMIDE 62.5 MCG/INH IN AEPB: 1.0000 | INHALATION_SPRAY | Freq: Every day | RESPIRATORY_TRACT | Status: AC

## 2015-04-15 MED ORDER — UMECLIDINIUM BROMIDE 62.5 MCG/INH IN AEPB: 1.0000 | INHALATION_SPRAY | Freq: Every day | RESPIRATORY_TRACT | Status: DC

## 2015-04-15 NOTE — Telephone Encounter (Signed)
LM for pt that I would leave samples of Incruse up front for pt to pickup while we are working on the PA.

## 2015-04-15 NOTE — Telephone Encounter (Signed)
Pt would like to get more samples of Incruse Ellipta. States with [employer], this medication needs a prior [place]. She is out . Please call.

## 2015-04-27 ENCOUNTER — Telehealth: Payer: Self-pay

## 2015-04-27 NOTE — Telephone Encounter (Signed)
States Incruse ellipta has been denied. Please call.

## 2015-04-27 NOTE — Telephone Encounter (Signed)
PA submitted for Incruse thru [facility]. Key: [id number] Pt ID: [id number]

## 2015-04-28 NOTE — Telephone Encounter (Signed)
Incruse was denied. Pt's insurance states she must try and fail Spiriva HH or Spiriva Respimat. Please advise.

## 2015-04-28 NOTE — Telephone Encounter (Signed)
See previous phone message for denial response.

## 2015-05-01 ENCOUNTER — Telehealth: Payer: Self-pay | Admitting: *Deleted

## 2015-05-01 NOTE — Telephone Encounter (Signed)
Pt is requesting Chantix be sent to the pharmacy please advise on the Chantix. Also since pt's Incruse has been denied by insurnace, what Spiriva would you like sent to her pharmacy since her insurance states she must try handihaler or respimat? Thanks.

## 2015-05-01 NOTE — Telephone Encounter (Signed)
Patient calling the office for samples of medication:   1.  What medication and dosage are you requesting samples for? Chantax  2.  Are you currently out of this medication? Yes, she states it is also very expensive. Would like to know if we can get some for her.   Pt also need some samples of Incruse

## 2015-05-04 MED ORDER — TIOTROPIUM BROMIDE MONOHYDRATE 18 MCG IN CAPS: 18.0000 ug | ORAL_CAPSULE | Freq: Every day | RESPIRATORY_TRACT | Status: DC

## 2015-05-04 MED ORDER — VARENICLINE TARTRATE 0.5 MG X 11 & 1 MG X 42 PO MISC: ORAL | Status: DC

## 2015-05-04 NOTE — Telephone Encounter (Signed)
LM for pt to return call in regards to medication.

## 2015-05-04 NOTE — Telephone Encounter (Signed)
Pt informed. Nothing further needed. 

## 2015-05-04 NOTE — Telephone Encounter (Signed)
Spiriva HandiHaler.

## 2015-05-04 NOTE — Telephone Encounter (Signed)
Spiriva HandiHaler is okay. Can start on Chantix starter pack. Advise patient to set a quit date, and start the Chantix 1 week before the proposed quit date.

## 2015-05-06 ENCOUNTER — Telehealth: Payer: Self-pay

## 2015-05-06 NOTE — Telephone Encounter (Signed)
Needs DX for chantix

## 2015-05-06 NOTE — Telephone Encounter (Signed)
Papers faxed in to [facility] with dx code. Nothing further needed.

## 2015-07-16 ENCOUNTER — Ambulatory Visit: Payer: Medicare Other | Admitting: Internal Medicine

## 2015-07-31 ENCOUNTER — Ambulatory Visit: Payer: Medicare Other | Admitting: Internal Medicine

## 2015-08-14 ENCOUNTER — Ambulatory Visit: Payer: Medicare Other | Admitting: Internal Medicine

## 2015-09-18 ENCOUNTER — Ambulatory Visit: Payer: Medicare Other

## 2015-09-30 ENCOUNTER — Ambulatory Visit: Payer: Medicare Other | Admitting: Internal Medicine

## 2015-10-26 ENCOUNTER — Ambulatory Visit: Payer: Medicare Other

## 2015-11-03 NOTE — Progress Notes (Deleted)
[facility]     Assessment and Plan:  Chronic bronchitis.  -Chronic expectoration which is worsened at night is likely secondary to chronic bronchitis and COPD. -She is given a sample of Incruz inhaler to be used for the next week. She is asked to call us back in 1 week to let us know if it is helping. She is asked to continue taking her Mucinex at night. -She expressed that she is having trouble affording her medications. We discussed that we can try using a different inhaler. If this one is too expensive.   OSA.  -Continue using CPAP every night.  Nicotine Abuse.  -We discussed setting a quit date for smoking cessation, at last visit, and the patient was given a prescription for Chantix.  Rheumatoid arthritis. -This can contribute to pulmonary disease, chest x-ray shows bibasilar atelectasis with chronic changes.  History of pulmonary embolism and DVT. -Currently on Coumadin.   Date: 11/03/2015  MRN# [id number] Cathy Fox 09-04-1955   Cathy Fox is a 60 y.o. old female seen in follow up for chief complaint of  No chief complaint on file.    HPI:   Patient is a 60 year old female, she was last seen with complaints of "bubbling" in her chest, this was thought to be secondary to chronic bronchitis with excess secretions. She was started on Incruz inhaler, which was not covered, therefore she was switched to Spiriva. She is also asked to use Mucinex every night. We discussed smoking cessation, and she was started on Spiriva, she was advised to move her dog out of the bed and the bedroom.  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   Chest x-ray from 01/02/2015 images and report reviewed, changes of chronic bronchitis, no sig can change when compared to previous film from 11/24/2009.  Medication:   Outpatient Encounter Prescriptions as of 11/05/2015  Medication Sig  . busPIRone (BUSPAR) 30 MG tablet Take 30 mg by mouth 3  (three) times daily.  . diazepam (VALIUM) 5 MG tablet Take 5 mg by mouth every 8 (eight) hours as needed. for anxiety  . enoxaparin (LOVENOX) 100 MG/ML injection Inject 100 mg into the skin every 12 (twelve) hours.  . famotidine (PEPCID) 20 MG tablet Take 20 mg by mouth 2 (two) times daily.  Marland Kitchen FLUoxetine (PROZAC) 20 MG capsule Take 60 mg by mouth daily. Three tablets (60 mg) in the morning  . folic acid (FOLVITE) 1 MG tablet Take 1 mg by mouth daily.  . furosemide (LASIX) 20 MG tablet Take 40 mg by mouth every morning. 20 mg in the evening  . glipiZIDE (GLUCOTROL XL) 10 MG 24 hr tablet Take 10 mg by mouth daily with breakfast.  . HYDROmorphone (DILAUDID) 4 MG tablet Take 4 mg by mouth 3 (three) times daily as needed for severe pain.  Marland Kitchen insulin NPH Human (HUMULIN N,NOVOLIN N) 100 UNIT/ML injection Inject 10 Units into the skin as needed. 10 units in the morning as needed and 15 units in the evening as needed for blood sugar > 150.  . metFORMIN (GLUCOPHAGE) 1000 MG tablet Take 1,000 mg by mouth 2 (two) times daily with a meal.  . oxymorphone (OPANA ER) 30 MG 12 hr tablet Take 30 mg by mouth every 12 (twelve) hours.  . potassium chloride (K-DUR,KLOR-CON) 10 MEQ tablet Take 10 mEq by mouth daily.  . pregabalin (LYRICA) 100 MG capsule Take 100 mg by mouth  2 (two) times daily. Two tablets (200 mg) in the morning, and one tablet (100 mg) in the evening  . PROAIR HFA 108 (90 Base) MCG/ACT inhaler INHALE 2 PUFFS PO Q 6 TO 8 H PRN FOR WHEEZING  . QVAR 80 MCG/ACT inhaler INHALE 2 PUFFS PO BID  . sulfamethoxazole-trimethoprim (BACTRIM) 400-80 MG per tablet Take 1 tablet by mouth 2 (two) times daily.  . SUMAtriptan (IMITREX) 50 MG tablet Take 50 mg by mouth every 2 (two) hours as needed for migraine. May repeat in 2 hours if headache persists or recurs.  Marland Kitchen tiotropium (SPIRIVA) 18 MCG inhalation capsule Place 1 capsule (18 mcg total) into inhaler and inhale daily.  . tizanidine (ZANAFLEX) 6 MG capsule Take 6 mg  by mouth 4 (four) times daily -  with meals and at bedtime.  Marland Kitchen Umeclidinium Bromide (INCRUSE ELLIPTA) 62.5 MCG/INH AEPB Inhale 1 puff into the lungs daily.  . varenicline (CHANTIX PAK) 0.5 MG X 11 & 1 MG X 42 tablet Take one 0.5 mg tablet by mouth once daily for 3 days, then increase to one 0.5 mg tablet twice daily for 4 days, then increase to one 1 mg tablet twice daily.   No facility-administered encounter medications on file as of 11/05/2015.      Allergies:  Review of patient's allergies indicates no known allergies.  Review of Systems: Gen:  Denies  fever, sweats. HEENT: Denies blurred vision. Cvc:  No dizziness, chest pain or heaviness Resp:   Denies cough or sputum porduction. Gi: Denies swallowing difficulty, stomach pain. constipation, bowel incontinence Gu:  Denies bladder incontinence, burning urine Ext:   No Joint pain, stiffness. Skin: No skin rash, easy bruising. Endoc:  No polyuria, polydipsia. Psych: No depression, insomnia. Other:  All other systems were reviewed and found to be negative other than what is mentioned in the HPI.   Physical Examination:   VS: There were no vitals taken for this visit.  General Appearance: No distress  Neuro:without focal findings,  speech normal,  HEENT: PERRLA, EOM intact. Pulmonary: normal breath sounds, No wheezing.   CardiovascularNormal S1,S2.  No m/r/g.   Abdomen: Benign, Soft, non-tender. Renal:  No costovertebral tenderness  GU:  Not performed at this time. Endoc: No evident thyromegaly, no signs of acromegaly. Skin:   warm, no rash. Extremities: normal, no cyanosis, clubbing.   LABORATORY PANEL:   CBC No results for input(s): WBC, HGB, HCT, PLT in the last 168 hours. ------------------------------------------------------------------------------------------------------------------  Chemistries  No results for input(s): NA, K, CL, CO2, GLUCOSE, BUN, CREATININE, CALCIUM, MG, AST, ALT, ALKPHOS, BILITOT in the last 168  hours.  Invalid input(s): GFRCGP ------------------------------------------------------------------------------------------------------------------  Cardiac Enzymes No results for input(s): TROPONINI in the last 168 hours. ------------------------------------------------------------  RADIOLOGY:   No results found for this or any previous visit. Results for orders placed during the hospital encounter of 04/09/15  DG Chest 2 View   Narrative CLINICAL DATA:  Congestion.  EXAM: CHEST  2 VIEW  COMPARISON:  01/02/2015 .  FINDINGS: Mediastinum and hilar structures normal. Low lung volumes with mild bibasilar atelectasis. No pleural effusion or pneumothorax. Cardiomegaly with normal pulmonary vascularity. No acute bony abnormality .  IMPRESSION: Low lung volumes with mild bibasilar atelectasis.   Electronically Signed   By: Maisie Fus  Register   On: 04/09/2015 11:36    ------------------------------------------------------------------------------------------------------------------  Thank  you for allowing [facility] to assist in the care of your patient. Our recommendations are noted above.  Please contact us if  we can be of further service.   Wells Guiles, MD.  [facility] Office Number: [phone]  Santiago Glad, M.D.  Stephanie Acre, M.D.  Billy Fischer, M.D  11/03/2015

## 2015-11-05 ENCOUNTER — Ambulatory Visit: Payer: Medicare Other | Admitting: Internal Medicine

## 2016-01-05 ENCOUNTER — Emergency Department: Payer: Medicare Other

## 2016-01-05 ENCOUNTER — Inpatient Hospital Stay
Admission: EM | Admit: 2016-01-05 | Discharge: 2016-01-07 | DRG: 871 | Disposition: A | Discharge status: Home / Self Care | Payer: Medicare Other | Attending: Internal Medicine | Admitting: Internal Medicine

## 2016-01-05 ENCOUNTER — Encounter: Payer: Self-pay | Admitting: Emergency Medicine

## 2016-01-05 DIAGNOSIS — A419 Sepsis, unspecified organism: Secondary | ICD-10-CM

## 2016-01-05 DIAGNOSIS — Z86711 Personal history of pulmonary embolism: Secondary | ICD-10-CM

## 2016-01-05 DIAGNOSIS — G4730 Sleep apnea, unspecified: Secondary | ICD-10-CM | POA: Diagnosis present

## 2016-01-05 DIAGNOSIS — M549 Dorsalgia, unspecified: Secondary | ICD-10-CM | POA: Diagnosis present

## 2016-01-05 DIAGNOSIS — E876 Hypokalemia: Secondary | ICD-10-CM | POA: Diagnosis present

## 2016-01-05 DIAGNOSIS — Z79891 Long term (current) use of opiate analgesic: Secondary | ICD-10-CM | POA: Diagnosis not present

## 2016-01-05 DIAGNOSIS — G8929 Other chronic pain: Secondary | ICD-10-CM | POA: Diagnosis present

## 2016-01-05 DIAGNOSIS — K219 Gastro-esophageal reflux disease without esophagitis: Secondary | ICD-10-CM | POA: Diagnosis present

## 2016-01-05 DIAGNOSIS — Z7951 Long term (current) use of inhaled steroids: Secondary | ICD-10-CM

## 2016-01-05 DIAGNOSIS — F419 Anxiety disorder, unspecified: Secondary | ICD-10-CM | POA: Diagnosis present

## 2016-01-05 DIAGNOSIS — E119 Type 2 diabetes mellitus without complications: Secondary | ICD-10-CM | POA: Diagnosis present

## 2016-01-05 DIAGNOSIS — F329 Major depressive disorder, single episode, unspecified: Secondary | ICD-10-CM | POA: Diagnosis present

## 2016-01-05 DIAGNOSIS — Z86718 Personal history of other venous thrombosis and embolism: Secondary | ICD-10-CM

## 2016-01-05 DIAGNOSIS — F17200 Nicotine dependence, unspecified, uncomplicated: Secondary | ICD-10-CM | POA: Diagnosis present

## 2016-01-05 DIAGNOSIS — R1011 Right upper quadrant pain: Secondary | ICD-10-CM

## 2016-01-05 DIAGNOSIS — Z7901 Long term (current) use of anticoagulants: Secondary | ICD-10-CM | POA: Diagnosis not present

## 2016-01-05 DIAGNOSIS — J189 Pneumonia, unspecified organism: Secondary | ICD-10-CM | POA: Diagnosis present

## 2016-01-05 DIAGNOSIS — M5030 Other cervical disc degeneration, unspecified cervical region: Secondary | ICD-10-CM | POA: Diagnosis present

## 2016-01-05 DIAGNOSIS — Z794 Long term (current) use of insulin: Secondary | ICD-10-CM

## 2016-01-05 DIAGNOSIS — M069 Rheumatoid arthritis, unspecified: Secondary | ICD-10-CM | POA: Diagnosis present

## 2016-01-05 DIAGNOSIS — M25532 Pain in left wrist: Secondary | ICD-10-CM | POA: Diagnosis present

## 2016-01-05 DIAGNOSIS — Z96659 Presence of unspecified artificial knee joint: Secondary | ICD-10-CM | POA: Diagnosis present

## 2016-01-05 LAB — URINALYSIS COMPLETE WITH MICROSCOPIC (ARMC ONLY)
BILIRUBIN URINE: NEGATIVE
GLUCOSE, UA: NEGATIVE mg/dL
KETONES UR: NEGATIVE mg/dL
NITRITE: POSITIVE — AB
PROTEIN: 30 mg/dL — AB
SPECIFIC GRAVITY, URINE: 1.008 (ref 1.005–1.030)
pH: 6 (ref 5.0–8.0)

## 2016-01-05 LAB — BASIC METABOLIC PANEL
ANION GAP: 11 (ref 5–15)
BUN: 11 mg/dL (ref 6–20)
CHLORIDE: 99 mmol/L — AB (ref 101–111)
CO2: 21 mmol/L — ABNORMAL LOW (ref 22–32)
Calcium: 8.4 mg/dL — ABNORMAL LOW (ref 8.9–10.3)
Creatinine, Ser: 0.81 mg/dL (ref 0.44–1.00)
GFR calc Af Amer: >60 mL/min (ref >60)
GFR calc non Af Amer: >60 mL/min (ref >60)
GLUCOSE: 116 mg/dL — AB (ref 65–99)
POTASSIUM: 3.1 mmol/L — AB (ref 3.5–5.1)
SODIUM: 131 mmol/L — AB (ref 135–145)

## 2016-01-05 LAB — TROPONIN I: Troponin I: <0.03 ng/mL (ref <0.03)

## 2016-01-05 LAB — HEPATIC FUNCTION PANEL
ALK PHOS: 90 U/L (ref 38–126)
ALT: 17 U/L (ref 14–54)
AST: 21 U/L (ref 15–41)
Albumin: 3.6 g/dL (ref 3.5–5.0)
BILIRUBIN TOTAL: 0.2 mg/dL — AB (ref 0.3–1.2)
Total Protein: 7.9 g/dL (ref 6.5–8.1)

## 2016-01-05 LAB — PROTIME-INR
INR: 2.79
Prothrombin Time: 30 seconds — ABNORMAL HIGH (ref 11.4–15.2)

## 2016-01-05 LAB — LACTIC ACID, PLASMA
LACTIC ACID, VENOUS: 2.1 mmol/L — AB (ref 0.5–1.9)
Lactic Acid, Venous: 1.1 mmol/L (ref 0.5–1.9)

## 2016-01-05 LAB — LIPASE, BLOOD: Lipase: 21 U/L (ref 11–51)

## 2016-01-05 LAB — CBC
HEMATOCRIT: 28.5 % — AB (ref 35.0–47.0)
HEMOGLOBIN: 9.1 g/dL — AB (ref 12.0–16.0)
MCH: 23.7 pg — ABNORMAL LOW (ref 26.0–34.0)
MCHC: 31.9 g/dL — ABNORMAL LOW (ref 32.0–36.0)
MCV: 74.3 fL — AB (ref 80.0–100.0)
Platelets: 269 10*3/uL (ref 150–440)
RBC: 3.84 MIL/uL (ref 3.80–5.20)
RDW: 21 % — AB (ref 11.5–14.5)
WBC: 11.3 10*3/uL — AB (ref 3.6–11.0)

## 2016-01-05 LAB — GLUCOSE, CAPILLARY: Glucose-Capillary: 171 mg/dL — ABNORMAL HIGH (ref 65–99)

## 2016-01-05 MED ORDER — DEXTROSE 5 % IV SOLN
500.0000 mg | INTRAVENOUS | Status: DC
  Filled 2016-01-05: qty 500

## 2016-01-05 MED ORDER — AZITHROMYCIN 500 MG PO TABS
500.0000 mg | ORAL_TABLET | Freq: Once | ORAL | Status: AC
  Administered 2016-01-05: 500 mg via ORAL
  Filled 2016-01-05: qty 1

## 2016-01-05 MED ORDER — ONDANSETRON HCL 4 MG PO TABS: 4.0000 mg | ORAL_TABLET | Freq: Four times a day (QID) | ORAL | Status: DC | PRN

## 2016-01-05 MED ORDER — SODIUM CHLORIDE 0.9 % IV BOLUS (SEPSIS): 1000.0000 mL | INTRAVENOUS | Status: DC | PRN

## 2016-01-05 MED ORDER — MORPHINE SULFATE ER 30 MG PO TBCR
30.0000 mg | EXTENDED_RELEASE_TABLET | Freq: Two times a day (BID) | ORAL | Status: DC
  Administered 2016-01-05 – 2016-01-07 (×4): 30 mg via ORAL
  Filled 2016-01-05 (×4): qty 1

## 2016-01-05 MED ORDER — SODIUM CHLORIDE 0.9 % IV SOLN
INTRAVENOUS | Status: DC
  Administered 2016-01-05 – 2016-01-06 (×2): via INTRAVENOUS

## 2016-01-05 MED ORDER — WARFARIN SODIUM 10 MG PO TABS
10.0000 mg | ORAL_TABLET | Freq: Once | ORAL | Status: AC
  Administered 2016-01-05: 10 mg via ORAL
  Filled 2016-01-05: qty 1

## 2016-01-05 MED ORDER — SODIUM CHLORIDE 0.9 % IV SOLN: 250.0000 mL | INTRAVENOUS | Status: DC | PRN

## 2016-01-05 MED ORDER — TIOTROPIUM BROMIDE MONOHYDRATE 18 MCG IN CAPS
18.0000 ug | ORAL_CAPSULE | Freq: Every day | RESPIRATORY_TRACT | Status: DC
  Administered 2016-01-05 – 2016-01-07 (×3): 18 ug via RESPIRATORY_TRACT
  Filled 2016-01-05: qty 5

## 2016-01-05 MED ORDER — CEFTRIAXONE SODIUM-DEXTROSE 1-3.74 GM-% IV SOLR
1.0000 g | Freq: Once | INTRAVENOUS | Status: AC
  Administered 2016-01-05: 1 g via INTRAVENOUS

## 2016-01-05 MED ORDER — ACETAMINOPHEN 500 MG PO TABS
1000.0000 mg | ORAL_TABLET | Freq: Once | ORAL | Status: AC
  Administered 2016-01-05: 1000 mg via ORAL
  Filled 2016-01-05: qty 2

## 2016-01-05 MED ORDER — FLUOXETINE HCL 20 MG PO CAPS
60.0000 mg | ORAL_CAPSULE | Freq: Every day | ORAL | Status: DC
  Administered 2016-01-06 – 2016-01-07 (×2): 60 mg via ORAL
  Filled 2016-01-05 (×2): qty 3

## 2016-01-05 MED ORDER — TIZANIDINE HCL 2 MG PO TABS
6.0000 mg | ORAL_TABLET | Freq: Three times a day (TID) | ORAL | Status: DC
  Administered 2016-01-05 – 2016-01-07 (×6): 6 mg via ORAL
  Filled 2016-01-05 (×7): qty 3

## 2016-01-05 MED ORDER — SODIUM CHLORIDE 0.9 % IV BOLUS (SEPSIS)
1000.0000 mL | Freq: Once | INTRAVENOUS | Status: AC
  Administered 2016-01-05: 1000 mL via INTRAVENOUS

## 2016-01-05 MED ORDER — ONDANSETRON HCL 4 MG/2ML IJ SOLN: 4.0000 mg | Freq: Four times a day (QID) | INTRAMUSCULAR | Status: DC | PRN

## 2016-01-05 MED ORDER — DEXTROSE 5 % IV SOLN: 1.0000 g | INTRAVENOUS | Status: DC

## 2016-01-05 MED ORDER — HYDROMORPHONE HCL 2 MG PO TABS
4.0000 mg | ORAL_TABLET | Freq: Every day | ORAL | Status: DC
  Administered 2016-01-05 – 2016-01-07 (×9): 4 mg via ORAL
  Filled 2016-01-05 (×9): qty 2

## 2016-01-05 MED ORDER — SODIUM CHLORIDE 0.9% FLUSH
3.0000 mL | Freq: Two times a day (BID) | INTRAVENOUS | Status: DC
  Administered 2016-01-05 – 2016-01-07 (×4): 3 mL via INTRAVENOUS

## 2016-01-05 MED ORDER — CEFTRIAXONE SODIUM-DEXTROSE 1-3.74 GM-% IV SOLR
INTRAVENOUS | Status: AC
  Administered 2016-01-05: 1 g via INTRAVENOUS
  Filled 2016-01-05: qty 50

## 2016-01-05 MED ORDER — ACETAMINOPHEN 325 MG PO TABS: 650.0000 mg | ORAL_TABLET | Freq: Four times a day (QID) | ORAL | Status: DC | PRN

## 2016-01-05 MED ORDER — UMECLIDINIUM BROMIDE 62.5 MCG/INH IN AEPB
1.0000 | INHALATION_SPRAY | Freq: Every day | RESPIRATORY_TRACT | Status: DC
  Administered 2016-01-05 – 2016-01-06 (×2): 1 via RESPIRATORY_TRACT
  Filled 2016-01-05: qty 7

## 2016-01-05 MED ORDER — DEXTROSE 5 % IV SOLN: 1.0000 g | Freq: Once | INTRAVENOUS | Status: DC

## 2016-01-05 MED ORDER — INSULIN ASPART 100 UNIT/ML ~~LOC~~ SOLN
0.0000 [IU] | Freq: Three times a day (TID) | SUBCUTANEOUS | Status: DC
  Administered 2016-01-06: 2 [IU] via SUBCUTANEOUS
  Administered 2016-01-06: 5 [IU] via SUBCUTANEOUS
  Administered 2016-01-06 – 2016-01-07 (×2): 1 [IU] via SUBCUTANEOUS
  Filled 2016-01-05 (×2): qty 1
  Filled 2016-01-05: qty 2
  Filled 2016-01-05: qty 5

## 2016-01-05 MED ORDER — SODIUM CHLORIDE 0.9% FLUSH
3.0000 mL | Freq: Two times a day (BID) | INTRAVENOUS | Status: DC
Start: 2016-01-05 — End: 2016-01-06
  Administered 2016-01-05: 3 mL via INTRAVENOUS

## 2016-01-05 MED ORDER — SODIUM CHLORIDE 0.9% FLUSH: 3.0000 mL | INTRAVENOUS | Status: DC | PRN

## 2016-01-05 MED ORDER — CEFTRIAXONE SODIUM-DEXTROSE 1-3.74 GM-% IV SOLR
1.0000 g | INTRAVENOUS | Status: DC
  Administered 2016-01-06: 1 g via INTRAVENOUS
  Filled 2016-01-05 (×2): qty 50

## 2016-01-05 MED ORDER — WARFARIN - PHARMACIST DOSING INPATIENT: Freq: Every day | Status: DC

## 2016-01-05 MED ORDER — ACETAMINOPHEN 650 MG RE SUPP: 650.0000 mg | Freq: Four times a day (QID) | RECTAL | Status: DC | PRN

## 2016-01-05 NOTE — ED Notes (Addendum)
Pt in via triage with complaints of feeling light headed and having some left sided chest pain as she was leaving [place].  Pt reports, "I have never had a panic attack but maybe that's what happened."  Pt reports that her son was dx with cancer back in July and she has since spent her days at home "crying and praying all day, I am unable to sleep at night."  Pt reports seeing her PCP and since been prescribed 7.5mg  valium but with no relief.  Pt A/Ox4, tachycardic and febrile upon arrival, other vitals WDL, no immediate distress noted at this time.

## 2016-01-05 NOTE — ED Notes (Signed)
Patient transported to Ultrasound 

## 2016-01-05 NOTE — H&P (Signed)
[facility] -  at [facility]   PATIENT NAME: Cathy Fox    MR#:  [id number]  DATE OF BIRTH:  [birth year]  DATE OF ADMISSION:  01/05/2016  PRIMARY CARE PHYSICIAN: SIMS, Merrilee Seashore, MD   REQUESTING/REFERRING PHYSICIAN: Osa Craver MD CHIEF COMPLAINT:   Chief Complaint  Patient presents with  . Chest Pain    HISTORY OF PRESENT ILLNESS: Cathy Fox  is a 60 y.o. female with a known history of Multiple medical problems including rheumatoid arthritis who went to see Dr. Cornelious Bryant because she was having significant amount of pain in the left wrist due to rheumatoid arthritis flare. She had her vitals checked in the office and was noted to have a temperature 103. Patient states that she has not been feeling well. Just has felt kind of weak and tired. Has had some cough but especially at night. She denies any chest pains. She did feel hot but did not check her temperature. She also has been feeling lightheaded. Her appetite has been okay. Denies any nausea vomiting or diarrhea. She does have chronic back pain is on chronic pain medications.        PAST MEDICAL HISTORY:   Past Medical History:  Diagnosis Date  . Anemia   . Anxiety   . Arthritis   . DDD (degenerative disc disease), cervical   . Depression   . Diabetes mellitus without complication (HCC)   . DVT (deep venous thrombosis) (HCC)   . GERD (gastroesophageal reflux disease)   . Headache   . History of pulmonary embolus (PE) 2005  . Hx of antiphospholipid antibody syndrome   . Pancreatitis [address] [place]  . Shortness of breath dyspnea   . Sleep apnea   . Vertigo     PAST SURGICAL HISTORY: Past Surgical History:  Procedure Laterality Date  . ABDOMINAL HYSTERECTOMY    . DILATION AND CURETTAGE OF UTERUS    . JOINT REPLACEMENT Left    Total knee Replacement  . NASAL SEPTOPLASTY W/ TURBINOPLASTY Bilateral 12/16/2014   Procedure: NASAL SEPTOPLASTY WITH BILATERAL TURBINATE  REDUCTION;  Surgeon: Linus Salmons, MD;  Location: [facility] ORS;  Service: ENT;  Laterality: Bilateral;    SOCIAL HISTORY:  Social History  Substance Use Topics  . Smoking status: Current Every Day Smoker    Packs/day: 1.00    Types: E-cigarettes  . Smokeless tobacco: Never Used  . Alcohol use No    FAMILY HISTORY:  Family History  Problem Relation Age of Onset  . Heart disease Mother   . Heart disease Father   . Kidney cancer Father   . Colon cancer Father     DRUG ALLERGIES: No Known Allergies  REVIEW OF SYSTEMS:   CONSTITUTIONAL: No fever, Positive fatigue and weakness.  EYES: No blurred or double vision.  EARS, NOSE, AND THROAT: No tinnitus or ear pain.  RESPIRATORY: Positive cough, positive shortness of breath,no wheezing or hemoptysis.  CARDIOVASCULAR: No chest pain, orthopnea, edema.  GASTROINTESTINAL: No nausea, vomiting, diarrhea or abdominal pain.  GENITOURINARY: No dysuria, hematuria.  ENDOCRINE: No polyuria, nocturia,  HEMATOLOGY: No anemia, easy bruising or bleeding SKIN: No rash or lesion. MUSCULOSKELETAL: No joint pain or arthritis.   NEUROLOGIC: No tingling, numbness, weakness.  PSYCHIATRY: No anxiety or depression.   MEDICATIONS AT HOME:  Prior to Admission medications   Medication Sig Start Date End Date Taking? Authorizing Provider  busPIRone (BUSPAR) 30 MG tablet Take 30 mg by mouth 3 (three) times daily.  Historical Provider, MD  diazepam (VALIUM) 5 MG tablet Take 5 mg by mouth every 8 (eight) hours as needed. for anxiety 04/03/15   Historical Provider, MD  enoxaparin (LOVENOX) 100 MG/ML injection Inject 100 mg into the skin every 12 (twelve) hours.    Historical Provider, MD  famotidine (PEPCID) 20 MG tablet Take 20 mg by mouth 2 (two) times daily.    Historical Provider, MD  FLUoxetine (PROZAC) 20 MG capsule Take 60 mg by mouth daily. Three tablets (60 mg) in the morning    Historical Provider, MD  folic acid (FOLVITE) 1 MG tablet Take 1 mg by  mouth daily.    Historical Provider, MD  furosemide (LASIX) 20 MG tablet Take 40 mg by mouth every morning. 20 mg in the evening    Historical Provider, MD  glipiZIDE (GLUCOTROL XL) 10 MG 24 hr tablet Take 10 mg by mouth daily with breakfast.    Historical Provider, MD  HYDROmorphone (DILAUDID) 4 MG tablet Take 4 mg by mouth 3 (three) times daily as needed for severe pain.    Historical Provider, MD  insulin NPH Human (HUMULIN N,NOVOLIN N) 100 UNIT/ML injection Inject 10 Units into the skin as needed. 10 units in the morning as needed and 15 units in the evening as needed for blood sugar > 150.    Historical Provider, MD  metFORMIN (GLUCOPHAGE) 1000 MG tablet Take 1,000 mg by mouth 2 (two) times daily with a meal.    Historical Provider, MD  oxymorphone (OPANA ER) 30 MG 12 hr tablet Take 30 mg by mouth every 12 (twelve) hours.    Historical Provider, MD  potassium chloride (K-DUR,KLOR-CON) 10 MEQ tablet Take 10 mEq by mouth daily.    Historical Provider, MD  pregabalin (LYRICA) 100 MG capsule Take 100 mg by mouth 2 (two) times daily. Two tablets (200 mg) in the morning, and one tablet (100 mg) in the evening    Historical Provider, MD  PROAIR HFA 108 (90 Base) MCG/ACT inhaler INHALE 2 PUFFS PO Q 6 TO 8 H PRN FOR WHEEZING 03/19/15   Historical Provider, MD  QVAR 80 MCG/ACT inhaler INHALE 2 PUFFS PO BID 03/27/15   Historical Provider, MD  sulfamethoxazole-trimethoprim (BACTRIM) 400-80 MG per tablet Take 1 tablet by mouth 2 (two) times daily. 12/16/14   Linus Salmons, MD  SUMAtriptan (IMITREX) 50 MG tablet Take 50 mg by mouth every 2 (two) hours as needed for migraine. May repeat in 2 hours if headache persists or recurs.    Historical Provider, MD  tiotropium (SPIRIVA) 18 MCG inhalation capsule Place 1 capsule (18 mcg total) into inhaler and inhale daily. 05/04/15   Shane Crutch, MD  tizanidine (ZANAFLEX) 6 MG capsule Take 6 mg by mouth 4 (four) times daily -  with meals and at bedtime.     Historical Provider, MD  Umeclidinium Bromide (INCRUSE ELLIPTA) 62.5 MCG/INH AEPB Inhale 1 puff into the lungs daily. 04/15/15   Shane Crutch, MD  varenicline (CHANTIX PAK) 0.5 MG X 11 & 1 MG X 42 tablet Take one 0.5 mg tablet by mouth once daily for 3 days, then increase to one 0.5 mg tablet twice daily for 4 days, then increase to one 1 mg tablet twice daily. 05/04/15   Shane Crutch, MD      PHYSICAL EXAMINATION:   VITAL SIGNS: Blood pressure (!) 137/59, pulse (!) 104, temperature 100.1 F (37.8 C), temperature source Oral, resp. rate 14, height 5\' 5"  (1.651 m), weight 210 lb (95.3  kg), SpO2 94 %.  GENERAL:  60 y.o.-year-old patient lying in the bed with no acute distress.  EYES: Pupils equal, round, reactive to light and accommodation. No scleral icterus. Extraocular muscles intact.  HEENT: Head atraumatic, normocephalic. Oropharynx and nasopharynx clear.  NECK:  Supple, no jugular venous distention. No thyroid enlargement, no tenderness.  LUNGS: Has rhonchus breath sounds bilaterally with no associated muscle usage  CARDIOVASCULAR: S1, S2 normal. No murmurs, rubs, or gallops.  ABDOMEN: Soft, nontender, nondistended. Bowel sounds present. No organomegaly or mass.  EXTREMITIES: She has a brace on the left wrist  NEUROLOGIC: Cranial nerves II through XII are intact. Muscle strength 5/5 in all extremities. Sensation intact. Gait not checked.  PSYCHIATRIC: The patient is alert and oriented x 3.  SKIN: No obvious rash, lesion, or ulcer.   LABORATORY PANEL:   CBC  Recent Labs Lab 01/05/16 1503  WBC 11.3*  HGB 9.1*  HCT 28.5*  PLT 269  MCV 74.3*  MCH 23.7*  MCHC 31.9*  RDW 21.0*   ------------------------------------------------------------------------------------------------------------------  Chemistries   Recent Labs Lab 01/05/16 1503  NA 131*  K 3.1*  CL 99*  CO2 21*  GLUCOSE 116*  BUN 11  CREATININE 0.81  CALCIUM 8.4*  AST 21  ALT 17  ALKPHOS 90   BILITOT 0.2*   ------------------------------------------------------------------------------------------------------------------ estimated creatinine clearance is 84.3 mL/min (by C-G formula based on SCr of 0.81 mg/dL). ------------------------------------------------------------------------------------------------------------------ No results for input(s): TSH, T4TOTAL, T3FREE, THYROIDAB in the last 72 hours.  Invalid input(s): FREET3   Coagulation profile  Recent Labs Lab 01/05/16 1503  INR 2.79   ------------------------------------------------------------------------------------------------------------------- No results for input(s): DDIMER in the last 72 hours. -------------------------------------------------------------------------------------------------------------------  Cardiac Enzymes  Recent Labs Lab 01/05/16 1503  TROPONINI <0.03   ------------------------------------------------------------------------------------------------------------------ Invalid input(s): POCBNP  ---------------------------------------------------------------------------------------------------------------  Urinalysis No results found for: COLORURINE, APPEARANCEUR, LABSPEC, PHURINE, GLUCOSEU, HGBUR, BILIRUBINUR, KETONESUR, PROTEINUR, UROBILINOGEN, NITRITE, LEUKOCYTESUR   RADIOLOGY: Dg Chest 2 View  Result Date: 01/05/2016 CLINICAL DATA:  Left-sided chest pain following cortisone shot today EXAM: CHEST  2 VIEW COMPARISON:  04/09/2015 FINDINGS: Cardiac shadow is stable. The lungs are well aerated bilaterally with patchy changes in the right middle lobe consistent with early infiltrate. Some left basilar atelectasis is noted as well. No bony abnormality is seen. IMPRESSION: Bibasilar changes consistent with atelectasis/ early infiltrate. Electronically Signed   By: Alcide Clever M.D.   On: 01/05/2016 16:29   US Abdomen Limited Ruq  Result Date: 01/05/2016 CLINICAL DATA:  Right upper  quadrant abdominal pain. EXAM: US ABDOMEN LIMITED - RIGHT UPPER QUADRANT COMPARISON:  11/26/2009 FINDINGS: Gallbladder: No gallstones or wall thickening visualized. No sonographic Murphy sign noted by sonographer. The prior tiny polyp shown on the ultrasound from 11/26/2009 was not well seen today. Common bile duct: Diameter: 4 mm Liver: No focal lesion identified. Coarse echogenic liver with poor sonic penetration compatible with diffuse hepatic steatosis. IMPRESSION: 1. Coarse echogenic liver with poor sonic penetration compatible with diffuse hepatic steatosis. Otherwise unremarkable. Electronically Signed   By: Gaylyn Rong M.D.   On: 01/05/2016 17:02    EKG: Orders placed or performed during the hospital encounter of 01/05/16  . ED EKG within 10 minutes  . EKG 12-Lead  . EKG 12-Lead  . ED EKG within 10 minutes    IMPRESSION AND PLAN: Patient is a 60 year old with history of rheumatoid arthritis on chronic methotrexate being admitted for pneumonia  1. Community-acquired pneumonia We'll treat with IV ceftriaxone and azithromycin  2. Diabetes type 2 Place  patient on sliding scale insulin and resume her home medications once updated  3. Hypokalemia replace potassium  4. Rheumatoid arthritis with flare per Dr. Cornelious Bryant he recommended prednisone taper  5. History of DVT and PE continue Coumadin will consult pharmacy to dose   All the records are reviewed and case discussed with ED provider. Management plans discussed with the patient, family and they are in agreement.  CODE STATUS: Code Status History    This patient does not have a recorded code status. Please follow your organizational policy for patients in this situation.       TOTAL TIME TAKING CARE OF THIS PATIENT:55 minutes.    Auburn Bilberry M.D on 01/05/2016 at 5:46 PM  Between 7am to 6pm - Pager - [phone]  After 6pm go to [email or web address] - password [email or web address] [facility]  [facility]  Office   [phone]  CC: Primary care physician; SIMS, Merrilee Seashore, MD

## 2016-01-05 NOTE — ED Provider Notes (Signed)
[facility] Emergency Department Provider Note  ____________________________________________  Time seen: Approximately 4:06 PM  I have reviewed the triage vital signs and the nursing notes.   HISTORY  Chief Complaint Chest Pain   HPI Cathy Fox is a 60 y.o. female with a history of RA on methotrexate, DVT/PE on Coumadin, pancreatitis who presents for evaluation of lightheadedness and fever. Patient reports 3 days of chills, night sweats, intermittent right upper quadrant abdominal pain. Today she went to see her primary care doctor for steroid injection for her rheumatoid arthritis. She was found to have a temp of 103F. she was given a prescription for Z-Pak and prednisone and he was sent for chest x-ray. As she was walking back to her car she felt like she was going to pass out. She then drove herself to the emergency room. She denies chest pain, shortness of breath, cough, sore throat, vomiting, diarrhea. She reports that she self caths multiple times a day due to bladder prolapse. She is also scheduled to see a surgeon for sludge in her GB and prior episode of pancreatitis. She reports intermittent right upper quadrant abdominal pain that has been going on for months. She reports the pain has gotten progressively worse over the last 3 days. Currently endorsing 6 out of 10 pain. She denies headache, neck stiffness, rash, tick bite. She reports that she took a flu shot this year.  Past Medical History:  Diagnosis Date  . Anemia   . Anxiety   . Arthritis   . DDD (degenerative disc disease), cervical   . Depression   . Diabetes mellitus without complication (HCC)   . DVT (deep venous thrombosis) (HCC)   . GERD (gastroesophageal reflux disease)   . Headache   . History of pulmonary embolus (PE) 2005  . Hx of antiphospholipid antibody syndrome   . Pancreatitis [address] [place]  . Shortness of breath dyspnea   . Sleep apnea   . Vertigo     There  are no active problems to display for this patient.   Past Surgical History:  Procedure Laterality Date  . ABDOMINAL HYSTERECTOMY    . DILATION AND CURETTAGE OF UTERUS    . JOINT REPLACEMENT Left    Total knee Replacement  . NASAL SEPTOPLASTY W/ TURBINOPLASTY Bilateral 12/16/2014   Procedure: NASAL SEPTOPLASTY WITH BILATERAL TURBINATE REDUCTION;  Surgeon: Linus Salmons, MD;  Location: [facility] ORS;  Service: ENT;  Laterality: Bilateral;    Prior to Admission medications   Medication Sig Start Date End Date Taking? Authorizing Provider  busPIRone (BUSPAR) 30 MG tablet Take 30 mg by mouth 3 (three) times daily.    Historical Provider, MD  diazepam (VALIUM) 5 MG tablet Take 5 mg by mouth every 8 (eight) hours as needed. for anxiety 04/03/15   Historical Provider, MD  enoxaparin (LOVENOX) 100 MG/ML injection Inject 100 mg into the skin every 12 (twelve) hours.    Historical Provider, MD  famotidine (PEPCID) 20 MG tablet Take 20 mg by mouth 2 (two) times daily.    Historical Provider, MD  FLUoxetine (PROZAC) 20 MG capsule Take 60 mg by mouth daily. Three tablets (60 mg) in the morning    Historical Provider, MD  folic acid (FOLVITE) 1 MG tablet Take 1 mg by mouth daily.    Historical Provider, MD  furosemide (LASIX) 20 MG tablet Take 40 mg by mouth every morning. 20 mg in the evening    Historical Provider, MD  glipiZIDE (GLUCOTROL XL) 10 MG 24 hr tablet Take 10 mg by mouth daily with breakfast.    Historical Provider, MD  HYDROmorphone (DILAUDID) 4 MG tablet Take 4 mg by mouth 3 (three) times daily as needed for severe pain.    Historical Provider, MD  insulin NPH Human (HUMULIN N,NOVOLIN N) 100 UNIT/ML injection Inject 10 Units into the skin as needed. 10 units in the morning as needed and 15 units in the evening as needed for blood sugar > 150.    Historical Provider, MD  metFORMIN (GLUCOPHAGE) 1000 MG tablet Take 1,000 mg by mouth 2 (two) times daily with a meal.    Historical Provider, MD    oxymorphone (OPANA ER) 30 MG 12 hr tablet Take 30 mg by mouth every 12 (twelve) hours.    Historical Provider, MD  potassium chloride (K-DUR,KLOR-CON) 10 MEQ tablet Take 10 mEq by mouth daily.    Historical Provider, MD  pregabalin (LYRICA) 100 MG capsule Take 100 mg by mouth 2 (two) times daily. Two tablets (200 mg) in the morning, and one tablet (100 mg) in the evening    Historical Provider, MD  PROAIR HFA 108 (90 Base) MCG/ACT inhaler INHALE 2 PUFFS PO Q 6 TO 8 H PRN FOR WHEEZING 03/19/15   Historical Provider, MD  QVAR 80 MCG/ACT inhaler INHALE 2 PUFFS PO BID 03/27/15   Historical Provider, MD  sulfamethoxazole-trimethoprim (BACTRIM) 400-80 MG per tablet Take 1 tablet by mouth 2 (two) times daily. 12/16/14   Linus Salmons, MD  SUMAtriptan (IMITREX) 50 MG tablet Take 50 mg by mouth every 2 (two) hours as needed for migraine. May repeat in 2 hours if headache persists or recurs.    Historical Provider, MD  tiotropium (SPIRIVA) 18 MCG inhalation capsule Place 1 capsule (18 mcg total) into inhaler and inhale daily. 05/04/15   Shane Crutch, MD  tizanidine (ZANAFLEX) 6 MG capsule Take 6 mg by mouth 4 (four) times daily -  with meals and at bedtime.    Historical Provider, MD  Umeclidinium Bromide (INCRUSE ELLIPTA) 62.5 MCG/INH AEPB Inhale 1 puff into the lungs daily. 04/15/15   Shane Crutch, MD  varenicline (CHANTIX PAK) 0.5 MG X 11 & 1 MG X 42 tablet Take one 0.5 mg tablet by mouth once daily for 3 days, then increase to one 0.5 mg tablet twice daily for 4 days, then increase to one 1 mg tablet twice daily. 05/04/15   Shane Crutch, MD    Allergies Review of patient's allergies indicates no known allergies.  Family History  Problem Relation Age of Onset  . Heart disease Mother   . Heart disease Father   . Kidney cancer Father   . Colon cancer Father     Social History Social History  Substance Use Topics  . Smoking status: Current Every Day Smoker    Packs/day: 1.00     Types: E-cigarettes  . Smokeless tobacco: Never Used  . Alcohol use No    Review of Systems  Constitutional: + fever, lightheadedness, chills, night sweats Eyes: Negative for visual changes. ENT: Negative for sore throat. Cardiovascular: Negative for chest pain. Respiratory: Negative for shortness of breath. Gastrointestinal: + RUQ abdominal pain and nausea. No vomiting or diarrhea. Genitourinary: Negative for dysuria. Musculoskeletal: Negative for back pain. Skin: Negative for rash. Neurological: Negative for headaches, weakness or numbness.  ____________________________________________   PHYSICAL EXAM:  VITAL SIGNS: ED Triage Vitals [01/05/16 1500]  Enc Vitals Group     BP  Pulse Rate (!) 107     Resp (!) 22     Temp (!) 101 F (38.3 C)     Temp Source Oral     SpO2 98 %     Weight 210 lb (95.3 kg)     Height 5\' 5"  (1.651 m)     Head Circumference      Peak Flow      Pain Score 6     Pain Loc      Pain Edu?      Excl. in GC?     Constitutional: Alert and oriented. Well appearing and in no apparent distress. HEENT:      Head: Normocephalic and atraumatic.         Eyes: Conjunctivae are normal. Sclera is non-icteric. EOMI. PERRL      Mouth/Throat: Mucous membranes are moist.       Neck: Supple with no signs of meningismus. Cardiovascular: Tachycardic with regular rhythm. No murmurs, gallops, or rubs. 2+ symmetrical distal pulses are present in all extremities. No JVD. Respiratory: Normal respiratory effort. Lungs are clear to auscultation bilaterally. No wheezes, crackles, or rhonchi.  Gastrointestinal: Soft, ttp over the RUQ, non distended with positive bowel sounds. No rebound or guarding. Genitourinary: No CVA tenderness. Musculoskeletal: Nontender with normal range of motion in all extremities. No edema, cyanosis, or erythema of extremities. Neurologic: Normal speech and language. Face is symmetric. Moving all extremities. No gross focal neurologic  deficits are appreciated. Skin: Skin is warm, dry and intact. No rash noted. Psychiatric: Mood and affect are normal. Speech and behavior are normal.  ____________________________________________   LABS (all labs ordered are listed, but only abnormal results are displayed)  Labs Reviewed  BASIC METABOLIC PANEL - Abnormal; Notable for the following:       Result Value   Sodium 131 (*)    Potassium 3.1 (*)    Chloride 99 (*)    CO2 21 (*)    Glucose, Bld 116 (*)    Calcium 8.4 (*)    All other components within normal limits  CBC - Abnormal; Notable for the following:    WBC 11.3 (*)    Hemoglobin 9.1 (*)    HCT 28.5 (*)    MCV 74.3 (*)    MCH 23.7 (*)    MCHC 31.9 (*)    RDW 21.0 (*)    All other components within normal limits  PROTIME-INR - Abnormal; Notable for the following:    Prothrombin Time 30.0 (*)    All other components within normal limits  HEPATIC FUNCTION PANEL - Abnormal; Notable for the following:    Total Bilirubin 0.2 (*)    Bilirubin, Direct <0.1 (*)    All other components within normal limits  CULTURE, BLOOD (ROUTINE X 2)  CULTURE, BLOOD (ROUTINE X 2)  URINE CULTURE  TROPONIN I  LACTIC ACID, PLASMA  LIPASE, BLOOD  LACTIC ACID, PLASMA  URINALYSIS COMPLETEWITH MICROSCOPIC ([facility] ONLY)  INFLUENZA PANEL BY PCR (TYPE A & B, H1N1)   ____________________________________________  EKG  ED ECG REPORT I, Nita Sicklearolina Arin Vanosdol, the attending physician, personally viewed and interpreted this ECG.  Sinus tachycardia, rate of 103, normal intervals, normal axis, T-wave inversions and inferior leads, no ST elevations. ST depressions are new when compared to prior from 2016 ____________________________________________  RADIOLOGY  CXR: Bibasilar changes consistent with atelectasis/ early infiltrate.  RUQ US: Coarse echogenic liver with poor sonic penetration compatible with diffuse hepatic steatosis. Otherwise  unremarkable ____________________________________________   PROCEDURES  Procedure(s) performed: None Procedures Critical Care performed:  Yes  CRITICAL CARE Performed by: Nita Sickle  ?  Total critical care time: 35 min  Critical care time was exclusive of separately billable procedures and treating other patients.  Critical care was necessary to treat or prevent imminent or life-threatening deterioration.  Critical care was time spent personally by me on the following activities: development of treatment plan with patient and/or surrogate as well as nursing, discussions with consultants, evaluation of patient's response to treatment, examination of patient, obtaining history from patient or surrogate, ordering and performing treatments and interventions, ordering and review of laboratory studies, ordering and review of radiographic studies, pulse oximetry and re-evaluation of patient's condition.  ____________________________________________   INITIAL IMPRESSION / ASSESSMENT AND PLAN / ED COURSE  60 y.o. female with a history of RA on methotrexate, DVT/PE on Coumadin, pancreatitis who presents for evaluation of lightheadedness and fever. Patient has had 3 days of night sweats, chills, body aches, intermittent right upper quadrant abdominal pain with known history of sludge. Upon arrival patient had a temperature of 101F, tachycardic to 107, tachypnea To the low 20s, maintaining her sats. She is otherwise well-appearing and in no distress, she has mild tenderness palpation in the right upper quadrant with no rebound or guarding. Patient self caths. Patient currently meet SIRS criteria with multiple possible sources in a patient who is immune suppressed including flu, pneumonia, gallbladder pathology, urinary tract infection. At this time we'll hold off on antibiotics because if patient is positive for flu she does not need to be exposed to antibiotics and will need Tamiflu. We'll  give her Tylenol, IV fluids, check labs, chest x-ray, right upper quadrant ultrasound.  Clinical Course  Comment By Time  CXR concerning for PNA. Lactate normal. WBC 11.3. RUQ [place] with no evidence of cholecystitis. Flu pending. Will give ceftriaxone and azithromycin for CA-PNA and admit. Nita Sickle, MD 10/10 1710    Pertinent labs & imaging results that were available during my care of the patient were reviewed by me and considered in my medical decision making (see chart for details).    ____________________________________________   FINAL CLINICAL IMPRESSION(S) / ED DIAGNOSES  Final diagnoses:  RUQ abdominal pain  Sepsis, due to unspecified organism [facility])  Community acquired pneumonia, unspecified laterality      NEW MEDICATIONS STARTED DURING THIS VISIT:  New Prescriptions   No medications on file     Note:  This document was prepared using Dragon voice recognition software and may include unintentional dictation errors.    Nita Sickle, MD 01/05/16 [phone]

## 2016-01-05 NOTE — ED Notes (Signed)
Pharmacy notified to adjust timing of IV antibiotics; initial doses given previously in ED.

## 2016-01-05 NOTE — ED Notes (Signed)
Patient transported to X-ray 

## 2016-01-05 NOTE — ED Notes (Signed)
Code  Sepsis  Called  To  Doug  At  [facility] 

## 2016-01-05 NOTE — Progress Notes (Signed)
ANTICOAGULATION CONSULT NOTE - Initial Consult  Pharmacy Consult for warfarin Indication: pulmonary embolus and DVT (history of)  No Known Allergies  Patient Measurements: Height: 5\' 5"  (165.1 cm) Weight: 210 lb (95.3 kg) IBW/kg (Calculated) : 57   Vital Signs: Temp: 100.1 F (37.8 C) (10/10 1728) Temp Source: Oral (10/10 1728) BP: 120/55 (10/10 1900) Pulse Rate: 97 (10/10 1900)  Labs:  Recent Labs  01/05/16 1503  HGB 9.1*  HCT 28.5*  PLT 269  LABPROT 30.0*  INR 2.79  CREATININE 0.81  TROPONINI <0.03    Estimated Creatinine Clearance: 84.3 mL/min (by C-G formula based on SCr of 0.81 mg/dL).   Medical History: Past Medical History:  Diagnosis Date  . Anemia   . Anxiety   . Arthritis   . DDD (degenerative disc disease), cervical   . Depression   . Diabetes mellitus without complication (HCC)   . DVT (deep venous thrombosis) (HCC)   . GERD (gastroesophageal reflux disease)   . Headache   . History of pulmonary embolus (PE) 2005  . Hx of antiphospholipid antibody syndrome   . Pancreatitis [address], [place]  . Shortness of breath dyspnea   . Sleep apnea   . Vertigo      Assessment: 60 yo female on warfarin PTA for hx of PE and DVT to continue on warfarin. Spoke with pt in the ED who states that she takes warfarin 10 mg every day except 15 mg (1 1/2 tab) on Sun and Thurs. Her last dose of warfarin was last night and she is due for a dose now/this evening. Pt being started on abx including azithromycin.  INR therapeutic on admission at 2.79  Goal of Therapy:  INR 2-3 Monitor platelets by anticoagulation protocol: Yes   Plan:  Will continue home dose of 10 mg for tonight. Will need to follow INR closely with abx being started. INR ordered daily. Will need to order CBC q3 days per policy.  Pharmacy will continue to follow.   Mon L 01/05/2016,7:17 PM

## 2016-01-05 NOTE — ED Triage Notes (Signed)
Pt here from [facility] with c/o left sided chest pain that started after leaving MD office; reports cough and chest congestion, just had chest xray done at MD's office.

## 2016-01-06 LAB — BASIC METABOLIC PANEL
ANION GAP: 7 (ref 5–15)
BUN: 7 mg/dL (ref 6–20)
CALCIUM: 7.5 mg/dL — AB (ref 8.9–10.3)
CO2: 22 mmol/L (ref 22–32)
Chloride: 108 mmol/L (ref 101–111)
Creatinine, Ser: 0.64 mg/dL (ref 0.44–1.00)
GFR calc Af Amer: >60 mL/min (ref >60)
Glucose, Bld: 181 mg/dL — ABNORMAL HIGH (ref 65–99)
POTASSIUM: 3.2 mmol/L — AB (ref 3.5–5.1)
SODIUM: 137 mmol/L (ref 135–145)

## 2016-01-06 LAB — PROTIME-INR
INR: 2.98
PROTHROMBIN TIME: 31.6 s — AB (ref 11.4–15.2)

## 2016-01-06 LAB — CBC
HCT: 22.3 % — ABNORMAL LOW (ref 35.0–47.0)
Hemoglobin: 7.5 g/dL — ABNORMAL LOW (ref 12.0–16.0)
MCH: 24.7 pg — ABNORMAL LOW (ref 26.0–34.0)
MCHC: 33.8 g/dL (ref 32.0–36.0)
MCV: 72.9 fL — ABNORMAL LOW (ref 80.0–100.0)
PLATELETS: 219 10*3/uL (ref 150–440)
RBC: 3.05 MIL/uL — ABNORMAL LOW (ref 3.80–5.20)
RDW: 20.5 % — AB (ref 11.5–14.5)
WBC: 8.4 10*3/uL (ref 3.6–11.0)

## 2016-01-06 LAB — INFLUENZA PANEL BY PCR (TYPE A & B)
H1N1 flu by pcr: NOT DETECTED
INFLBPCR: NEGATIVE
Influenza A By PCR: NEGATIVE

## 2016-01-06 LAB — GLUCOSE, CAPILLARY
GLUCOSE-CAPILLARY: 170 mg/dL — AB (ref 65–99)
GLUCOSE-CAPILLARY: 359 mg/dL — AB (ref 65–99)
Glucose-Capillary: 125 mg/dL — ABNORMAL HIGH (ref 65–99)
Glucose-Capillary: 271 mg/dL — ABNORMAL HIGH (ref 65–99)

## 2016-01-06 LAB — LACTIC ACID, PLASMA: LACTIC ACID, VENOUS: 1.1 mmol/L (ref 0.5–1.9)

## 2016-01-06 MED ORDER — DIAZEPAM 5 MG PO TABS
7.5000 mg | ORAL_TABLET | Freq: Three times a day (TID) | ORAL | Status: DC
  Administered 2016-01-06 – 2016-01-07 (×4): 7.5 mg via ORAL
  Filled 2016-01-06 (×4): qty 2

## 2016-01-06 MED ORDER — FOLIC ACID 1 MG PO TABS
2.0000 mg | ORAL_TABLET | Freq: Every day | ORAL | Status: DC
  Administered 2016-01-06 – 2016-01-07 (×2): 2 mg via ORAL
  Filled 2016-01-06 (×2): qty 2

## 2016-01-06 MED ORDER — DOCUSATE SODIUM 100 MG PO CAPS
100.0000 mg | ORAL_CAPSULE | Freq: Two times a day (BID) | ORAL | Status: DC
  Administered 2016-01-06 – 2016-01-07 (×3): 100 mg via ORAL
  Filled 2016-01-06 (×3): qty 1

## 2016-01-06 MED ORDER — GUAIFENESIN-DM 100-10 MG/5ML PO SYRP
5.0000 mL | ORAL_SOLUTION | ORAL | Status: DC | PRN
  Administered 2016-01-06 (×2): 5 mL via ORAL
  Filled 2016-01-06 (×2): qty 5

## 2016-01-06 MED ORDER — PREDNISONE 50 MG PO TABS
50.0000 mg | ORAL_TABLET | Freq: Every day | ORAL | Status: DC
  Administered 2016-01-06 – 2016-01-07 (×2): 50 mg via ORAL
  Filled 2016-01-06 (×2): qty 1

## 2016-01-06 MED ORDER — FOLIC ACID 1 MG PO TABS: 1.0000 mg | ORAL_TABLET | Freq: Two times a day (BID) | ORAL | Status: DC

## 2016-01-06 MED ORDER — WARFARIN SODIUM 5 MG PO TABS
5.0000 mg | ORAL_TABLET | Freq: Once | ORAL | Status: AC
  Administered 2016-01-06: 5 mg via ORAL
  Filled 2016-01-06: qty 1

## 2016-01-06 MED ORDER — PREDNISONE 50 MG PO TABS: 50.0000 mg | ORAL_TABLET | Freq: Every day | ORAL | Status: DC

## 2016-01-06 MED ORDER — GUAIFENESIN ER 600 MG PO TB12
1200.0000 mg | ORAL_TABLET | Freq: Two times a day (BID) | ORAL | Status: DC
  Administered 2016-01-06 – 2016-01-07 (×3): 1200 mg via ORAL
  Filled 2016-01-06 (×3): qty 2

## 2016-01-06 MED ORDER — FAMOTIDINE 20 MG PO TABS
20.0000 mg | ORAL_TABLET | Freq: Two times a day (BID) | ORAL | Status: DC
  Administered 2016-01-06 – 2016-01-07 (×3): 20 mg via ORAL
  Filled 2016-01-06 (×3): qty 1

## 2016-01-06 MED ORDER — FUROSEMIDE 20 MG PO TABS
20.0000 mg | ORAL_TABLET | Freq: Every evening | ORAL | Status: DC
  Administered 2016-01-06: 20 mg via ORAL
  Filled 2016-01-06: qty 1

## 2016-01-06 MED ORDER — MORPHINE SULFATE ER 30 MG PO TBCR: 30.0000 mg | EXTENDED_RELEASE_TABLET | Freq: Two times a day (BID) | ORAL | Status: DC

## 2016-01-06 MED ORDER — WARFARIN SODIUM 5 MG PO TABS: 10.0000 mg | ORAL_TABLET | ORAL | Status: DC

## 2016-01-06 MED ORDER — AZITHROMYCIN 250 MG PO TABS
250.0000 mg | ORAL_TABLET | Freq: Every day | ORAL | Status: DC
  Administered 2016-01-06: 250 mg via ORAL
  Filled 2016-01-06: qty 1

## 2016-01-06 MED ORDER — METHOTREXATE SODIUM CHEMO INJECTION 50 MG/2ML
20.0000 mg | INTRAMUSCULAR | Status: DC
  Administered 2016-01-06: 20 mg via SUBCUTANEOUS
  Filled 2016-01-06: qty 0.8

## 2016-01-06 MED ORDER — BUSPIRONE HCL 10 MG PO TABS
20.0000 mg | ORAL_TABLET | Freq: Three times a day (TID) | ORAL | Status: DC
  Administered 2016-01-06 – 2016-01-07 (×4): 20 mg via ORAL
  Filled 2016-01-06 (×4): qty 2

## 2016-01-06 MED ORDER — FUROSEMIDE 20 MG PO TABS
40.0000 mg | ORAL_TABLET | Freq: Every morning | ORAL | Status: DC
  Administered 2016-01-06 – 2016-01-07 (×2): 40 mg via ORAL
  Filled 2016-01-06 (×2): qty 2

## 2016-01-06 MED ORDER — FLUTICASONE PROPIONATE 50 MCG/ACT NA SUSP
1.0000 | Freq: Every day | NASAL | Status: DC
  Administered 2016-01-06 – 2016-01-07 (×2): 1 via NASAL
  Filled 2016-01-06: qty 16

## 2016-01-06 MED ORDER — SUMATRIPTAN SUCCINATE 50 MG PO TABS: 50.0000 mg | ORAL_TABLET | ORAL | Status: DC | PRN

## 2016-01-06 NOTE — Progress Notes (Signed)
Pt continues to refuse CPAP placement at bedtime. Family member at bedside. Pt encouraged to notify staff should she change her mind.

## 2016-01-06 NOTE — Progress Notes (Signed)
Pt is refusing to CPAP. Offered pt a hospital issued CPAP and she states she will have someone bring hers tomorrow from home

## 2016-01-06 NOTE — Progress Notes (Signed)
[facility] - [place] at [facility]   PATIENT NAME: Cathy Fox    MRN#:  [id number]  DATE OF BIRTH:  04/25/1955  SUBJECTIVE:  Hospital Day: 1 day Cathy Fox is a 60 y.o. female presenting with Chest Pain .   Overnight events: No acute overnight events Interval Events: Breathing feels improved but still short of breath with exertion  REVIEW OF SYSTEMS:  CONSTITUTIONAL: No fever, fatigue or weakness.  EYES: No blurred or double vision.  EARS, NOSE, AND THROAT: No tinnitus or ear pain.  RESPIRATORY: No cough, Positive shortness of breath, denies wheezing or hemoptysis.  CARDIOVASCULAR: No chest pain, orthopnea, edema.  GASTROINTESTINAL: No nausea, vomiting, diarrhea or abdominal pain.  GENITOURINARY: No dysuria, hematuria.  ENDOCRINE: No polyuria, nocturia,  HEMATOLOGY: No anemia, easy bruising or bleeding SKIN: No rash or lesion. MUSCULOSKELETAL: No joint pain or arthritis.   NEUROLOGIC: No tingling, numbness, weakness.  PSYCHIATRY: No anxiety or depression.   DRUG ALLERGIES:  No Known Allergies  VITALS:  Blood pressure 122/74, pulse 90, temperature 98.7 F (37.1 C), temperature source Oral, resp. rate 18, height 5\' 5"  (1.651 m), weight 95.3 kg (210 lb), SpO2 90 %.  PHYSICAL EXAMINATION:  VITAL SIGNS: Vitals:   01/06/16 0538 01/06/16 1225  BP: 119/76 122/74  Pulse: 92 90  Resp: 17 18  Temp: 99 F (37.2 C) 98.7 F (37.1 C)   GENERAL:60 y.o.female currently in no acute distress.  HEAD: Normocephalic, atraumatic.  EYES: Pupils equal, round, reactive to light. Extraocular muscles intact. No scleral icterus.  MOUTH: Moist mucosal membrane. Dentition intact. No abscess noted.  EAR, NOSE, THROAT: Clear without exudates. No external lesions.  NECK: Supple. No thyromegaly. No nodules. No JVD.  PULMONARY: Scant coarse rhonchi left lower lobe without wheeze No use of accessory muscles, Good respiratory effort. good air entry bilaterally CHEST:  Nontender to palpation.  CARDIOVASCULAR: S1 and S2. Regular rate and rhythm. No murmurs, rubs, or gallops. No edema. Pedal pulses 2+ bilaterally.  GASTROINTESTINAL: Soft, nontender, nondistended. No masses. Positive bowel sounds. No hepatosplenomegaly.  MUSCULOSKELETAL: No swelling, clubbing, or edema. Range of motion full in all extremities.  NEUROLOGIC: Cranial nerves II through XII are intact. No gross focal neurological deficits. Sensation intact. Reflexes intact.  SKIN: No ulceration, lesions, rashes, or cyanosis. Skin warm and dry. Turgor intact.  PSYCHIATRIC: Mood, affect within normal limits. The patient is awake, alert and oriented x 3. Insight, judgment intact.      LABORATORY PANEL:   CBC  Recent Labs Lab 01/06/16 0020  WBC 8.4  HGB 7.5*  HCT 22.3*  PLT 219   ------------------------------------------------------------------------------------------------------------------  Chemistries   Recent Labs Lab 01/05/16 1503 01/06/16 0020  NA 131* 137  K 3.1* 3.2*  CL 99* 108  CO2 21* 22  GLUCOSE 116* 181*  BUN 11 7  CREATININE 0.81 0.64  CALCIUM 8.4* 7.5*  AST 21  --   ALT 17  --   ALKPHOS 90  --   BILITOT 0.2*  --    ------------------------------------------------------------------------------------------------------------------  Cardiac Enzymes  Recent Labs Lab 01/05/16 1503  TROPONINI <0.03   ------------------------------------------------------------------------------------------------------------------  RADIOLOGY:  Dg Chest 2 View  Result Date: 01/05/2016 CLINICAL DATA:  Left-sided chest pain following cortisone shot today EXAM: CHEST  2 VIEW COMPARISON:  04/09/2015 FINDINGS: Cardiac shadow is stable. The lungs are well aerated bilaterally with patchy changes in the right middle lobe consistent with early infiltrate. Some left basilar atelectasis is noted as well. No bony abnormality is seen.  IMPRESSION: Bibasilar changes consistent with  atelectasis/ early infiltrate. Electronically Signed   By: Alcide Clever M.D.   On: 01/05/2016 16:29   US Abdomen Limited Ruq  Result Date: 01/05/2016 CLINICAL DATA:  Right upper quadrant abdominal pain. EXAM: US ABDOMEN LIMITED - RIGHT UPPER QUADRANT COMPARISON:  11/26/2009 FINDINGS: Gallbladder: No gallstones or wall thickening visualized. No sonographic Murphy sign noted by sonographer. The prior tiny polyp shown on the ultrasound from 11/26/2009 was not well seen today. Common bile duct: Diameter: 4 mm Liver: No focal lesion identified. Coarse echogenic liver with poor sonic penetration compatible with diffuse hepatic steatosis. IMPRESSION: 1. Coarse echogenic liver with poor sonic penetration compatible with diffuse hepatic steatosis. Otherwise unremarkable. Electronically Signed   By: Gaylyn Rong M.D.   On: 01/05/2016 17:02    EKG:   Orders placed or performed during the hospital encounter of 01/05/16  . ED EKG within 10 minutes  . EKG 12-Lead  . EKG 12-Lead  . ED EKG within 10 minutes    ASSESSMENT AND PLAN:   Cathy Fox is a 60 y.o. female presenting with Chest Pain . Admitted 01/05/2016 : Day #: 1 day 1. Sepsis, present on admission Source community-acquired pneumonia Panculture. Broad-spectrum antibiotics including azithromycin/ceftriaxone and taper antibiotics when culture data returns.  Continue breathing treatments 2. Rheumatoid arthritis flare: Patient recently seen by rheumatology recommending steroid taper ordered 3. Type 2 diabetes non-insulin-requiring hold oral agents add sliding scale coverage 4. History of DVT: Continue warfarin pharmacy for dosing  All the records are reviewed and case discussed with Care Management/Social Workerr. Management plans discussed with the patient, family and they are in agreement.  CODE STATUS: full TOTAL TIME TAKING CARE OF THIS PATIENT: 28 minutes.   POSSIBLE D/C IN 1-2DAYS, DEPENDING ON CLINICAL CONDITION.   Hower,  Mardi Mainland.D on 01/06/2016 at 2:02 PM  Between 7am to 6pm - Pager - [phone]  After 6pm: House Pager: - [phone]  [facility]  Office  [phone]  CC: Primary care physician; SIMS, Merrilee Seashore, MD

## 2016-01-06 NOTE — Progress Notes (Signed)
ANTICOAGULATION CONSULT NOTE - Initial Consult  Pharmacy Consult for warfarin Indication: pulmonary embolus and DVT (history of)  No Known Allergies  Patient Measurements: Height: 5\' 5"  (165.1 cm) Weight: 210 lb (95.3 kg) IBW/kg (Calculated) : 57   Vital Signs: Temp: 99 F (37.2 C) (10/11 0538) Temp Source: Oral (10/11 0538) BP: 119/76 (10/11 0538) Pulse Rate: 92 (10/11 0538)  Labs:  Recent Labs  01/05/16 1503 01/06/16 0020  HGB 9.1* 7.5*  HCT 28.5* 22.3*  PLT 269 219  LABPROT 30.0* 31.6*  INR 2.79 2.98  CREATININE 0.81 0.64  TROPONINI <0.03  --     Estimated Creatinine Clearance: 85.4 mL/min (by C-G formula based on SCr of 0.64 mg/dL).   Medical History: Past Medical History:  Diagnosis Date  . Anemia   . Anxiety   . Arthritis   . DDD (degenerative disc disease), cervical   . Depression   . Diabetes mellitus without complication (HCC)   . DVT (deep venous thrombosis) (HCC)   . GERD (gastroesophageal reflux disease)   . Headache   . History of pulmonary embolus (PE) 2005  . Hx of antiphospholipid antibody syndrome   . Pancreatitis [address], [place]  . Shortness of breath dyspnea   . Sleep apnea   . Vertigo      Assessment: 60 yo female on warfarin PTA for hx of PE and DVT to continue on warfarin. Spoke with pt in the ED who states that she takes warfarin 10 mg every day except 15 mg (1 1/2 tab) on Sun and Thurs. Her last dose of warfarin was last night and she is due for a dose now/this evening. Pt being started on abx including azithromycin.  INR therapeutic on admission at 2.79  10/10 INR: 2.79, warfarin 10mg  10/11 INR: 2.98  Goal of Therapy:  INR 2-3 Monitor platelets by anticoagulation protocol: Yes   Plan:  Will decrease to warfarin 5mg  for tonight. Will need to follow INR closely with abx being started. INR ordered daily. Will need to order CBC q3 days per policy.  Pharmacy will continue to follow.   Mon, PharmD,  BCPS 01/06/2016 9:54 AM

## 2016-01-07 LAB — GLUCOSE, CAPILLARY: Glucose-Capillary: 131 mg/dL — ABNORMAL HIGH (ref 65–99)

## 2016-01-07 LAB — PROTIME-INR
INR: 3.44
PROTHROMBIN TIME: 35.5 s — AB (ref 11.4–15.2)

## 2016-01-07 MED ORDER — LEVOFLOXACIN 750 MG PO TABS: 750.0000 mg | ORAL_TABLET | Freq: Every day | ORAL | 0 refills | Status: AC

## 2016-01-07 MED ORDER — PREDNISONE 10 MG (21) PO TBPK: ORAL_TABLET | ORAL | 0 refills | Status: DC

## 2016-01-07 MED ORDER — TIOTROPIUM BROMIDE MONOHYDRATE 18 MCG IN CAPS: 18.0000 ug | ORAL_CAPSULE | Freq: Every day | RESPIRATORY_TRACT | 12 refills | Status: DC

## 2016-01-07 MED ORDER — FLUTICASONE PROPIONATE 50 MCG/ACT NA SUSP: 1.0000 | Freq: Every day | NASAL | 0 refills | Status: DC

## 2016-01-07 MED ORDER — MIRTAZAPINE 15 MG PO TABS: 15.0000 mg | ORAL_TABLET | Freq: Every day | ORAL | 0 refills | Status: DC

## 2016-01-07 NOTE — Progress Notes (Signed)
ANTICOAGULATION CONSULT NOTE - Initial Consult  Pharmacy Consult for warfarin Indication: pulmonary embolus and DVT (history of)  No Known Allergies  Patient Measurements: Height: 5\' 5"  (165.1 cm) Weight: 210 lb (95.3 kg) IBW/kg (Calculated) : 57   Vital Signs: Temp: 97.8 F (36.6 C) (10/12 0435) Temp Source: Oral (10/12 0435) BP: 125/63 (10/12 0435) Pulse Rate: 68 (10/12 0435)  Labs:  Recent Labs  01/05/16 1503 01/06/16 0020 01/07/16 0502  HGB 9.1* 7.5*  --   HCT 28.5* 22.3*  --   PLT 269 219  --   LABPROT 30.0* 31.6* 35.5*  INR 2.79 2.98 3.44  CREATININE 0.81 0.64  --   TROPONINI <0.03  --   --     Estimated Creatinine Clearance: 85.4 mL/min (by C-G formula based on SCr of 0.64 mg/dL).   Medical History: Past Medical History:  Diagnosis Date  . Anemia   . Anxiety   . Arthritis   . DDD (degenerative disc disease), cervical   . Depression   . Diabetes mellitus without complication (HCC)   . DVT (deep venous thrombosis) (HCC)   . GERD (gastroesophageal reflux disease)   . Headache   . History of pulmonary embolus (PE) 2005  . Hx of antiphospholipid antibody syndrome   . Pancreatitis [address], [place]  . Shortness of breath dyspnea   . Sleep apnea   . Vertigo      Assessment: 60 yo female on warfarin PTA for hx of PE and DVT to continue on warfarin. Spoke with pt in the ED who states that she takes warfarin 10 mg every day except 15 mg (1 1/2 tab) on Sun and Thurs. Her last dose of warfarin was last night and she is due for a dose now/this evening. Pt being started on abx including azithromycin.  INR therapeutic on admission at 2.79  10/10 INR: 2.79, warfarin 10mg  10/11 INR: 2.98, warfarin 5mg  10/12 INR: 3.44  Goal of Therapy:  INR 2-3 Monitor platelets by anticoagulation protocol: Yes   Plan:  Will hold warfarin tonight. Will need to follow INR closely with abx being started. INR ordered daily. Will need to order CBC q3 days per  policy.  Pharmacy will continue to follow.   Tue, PharmD, BCPS 01/07/2016 10:13 AM

## 2016-01-07 NOTE — Discharge Summary (Signed)
[facility] [place] NAME: Cathy Fox    MR#:  [id number]  DATE OF BIRTH:  04/23/1955  DATE OF ADMISSION:  01/05/2016 ADMITTING PHYSICIAN: Dustin Flock, MD  DATE OF DISCHARGE: 01/07/16  PRIMARY CARE PHYSICIAN: SIMS, Daralene Milch, MD    ADMISSION DIAGNOSIS:  RUQ abdominal pain [R10.11] Sepsis, due to unspecified organism ([place]) [A41.9] Community acquired pneumonia, unspecified laterality [J18.9]  DISCHARGE DIAGNOSIS:  Sepsis present on admission - resolved CAP depression  SECONDARY DIAGNOSIS:   Past Medical History:  Diagnosis Date  . Anemia   . Anxiety   . Arthritis   . DDD (degenerative disc disease), cervical   . Depression   . Diabetes mellitus without complication ([place])   . DVT (deep venous thrombosis) ([place])   . GERD (gastroesophageal reflux disease)   . Headache   . History of pulmonary embolus (PE) 2005  . Hx of antiphospholipid antibody syndrome   . Pancreatitis [address] [place]  . Shortness of breath dyspnea   . Sleep apnea   . Vertigo     HOSPITAL COURSE:  Cathy Fox  is a 60 y.o. female admitted 01/05/2016 with chief complaint Chest Pain . Please see H&P performed by Dustin Flock, MD for further information. Patient presented with the above symptoms including shortness of breath. She met septic criteria on admission, found to have pneumonia, placed on antibiotics with improvement. She was also started on steroids for RA flare - per rheum, and remeron added to help control sleep, appetite and depressive symptoms  DISCHARGE CONDITIONS:   stable  CONSULTS OBTAINED:    DRUG ALLERGIES:  No Known Allergies  DISCHARGE MEDICATIONS:   Current Discharge Medication List    START taking these medications   Details  fluticasone (FLONASE) 50 MCG/ACT nasal spray Place 1 spray into both nostrils daily. Qty: 16 g, Refills: 0    levofloxacin (LEVAQUIN) 750 MG tablet Take 1 tablet (750 mg total) by  mouth daily. Qty: 3 tablet, Refills: 0    mirtazapine (REMERON) 15 MG tablet Take 1 tablet (15 mg total) by mouth at bedtime. Qty: 30 tablet, Refills: 0    predniSONE (STERAPRED UNI-PAK 21 TAB) 10 MG (21) TBPK tablet 38m x1 day, 20 mg x2 day, 10 mg x2 day then stop Qty: 10 tablet, Refills: 0      CONTINUE these medications which have CHANGED   Details  tiotropium (SPIRIVA) 18 MCG inhalation capsule Place 1 capsule (18 mcg total) into inhaler and inhale daily. Qty: 30 capsule, Refills: 12      CONTINUE these medications which have NOT CHANGED   Details  busPIRone (BUSPAR) 10 MG tablet Take 20 mg by mouth 3 (three) times daily.    diazepam (VALIUM) 5 MG tablet Take 7.5 mg by mouth 3 (three) times daily. for anxiety Refills: 1    docusate sodium (STOOL SOFTENER) 100 MG capsule Take 100 mg by mouth 2 (two) times daily.    enoxaparin (LOVENOX) 100 MG/ML injection Inject 100 mg into the skin every 12 (twelve) hours. As needed prescription if patient had to have surgery.    famotidine (PEPCID) 20 MG tablet Take 20 mg by mouth 2 (two) times daily.    FLUoxetine (PROZAC) 20 MG capsule Take 60 mg by mouth every morning.     folic acid (FOLVITE) 1 MG tablet Take 1 mg by mouth 2 (two) times daily.     furosemide (LASIX) 20 MG tablet Take 20-40 mg  by mouth See admin instructions. Take 2 tablets (40 mg) by mouth every morning and Take 20 mg by mouth in the evening.    glipiZIDE (GLUCOTROL XL) 5 MG 24 hr tablet Take 5 mg by mouth daily with breakfast.    Guaifenesin 1200 MG TB12 Take 1,200 mg by mouth 2 (two) times daily.    HYDROmorphone (DILAUDID) 4 MG tablet Take 4 mg by mouth See admin instructions. Take 4 mg by mouth every 4 to 6 hours as needed for pain. *Not to exceed 5 a day*    metFORMIN (GLUCOPHAGE) 1000 MG tablet Take 1,000 mg by mouth 2 (two) times daily with a meal.    methotrexate 50 MG/2ML injection Inject 0.8 mLs into the skin once a week. Use on Wednesday.    morphine  (MS CONTIN) 30 MG 12 hr tablet Take 30 mg by mouth every 12 (twelve) hours.    PROAIR HFA 108 (90 Base) MCG/ACT inhaler INHALE 2 PUFFS PO Q 6 TO 8 H PRN FOR WHEEZING Refills: 10    SUMAtriptan (IMITREX) 50 MG tablet Take 50 mg by mouth every 2 (two) hours as needed for migraine. May repeat in 2 hours if headache persists or recurs.    tizanidine (ZANAFLEX) 6 MG capsule Take 6 mg by mouth 4 (four) times daily -  with meals and at bedtime.    varenicline (CHANTIX PAK) 0.5 MG X 11 & 1 MG X 42 tablet Take one 0.5 mg tablet by mouth once daily for 3 days, then increase to one 0.5 mg tablet twice daily for 4 days, then increase to one 1 mg tablet twice daily. Qty: 53 tablet, Refills: 0    warfarin (COUMADIN) 10 MG tablet Take 10-15 mg by mouth See admin instructions. Take 10 mg tablet by mouth every evening on Monday, Tuesday, Wednesday, Friday, Saturday. Take 15 mg tablet by mouth in the evening on Thursday and Sunday.      STOP taking these medications     sulfamethoxazole-trimethoprim (BACTRIM) 400-80 MG per tablet          DISCHARGE INSTRUCTIONS:    DIET:  Regular diet  DISCHARGE CONDITION:  Stable  ACTIVITY:  Activity as tolerated  OXYGEN:  Home Oxygen: No.   Oxygen Delivery: room air  DISCHARGE LOCATION:  home   If you experience worsening of your admission symptoms, develop shortness of breath, life threatening emergency, suicidal or homicidal thoughts you must seek medical attention immediately by calling 911 or calling your MD immediately  if symptoms less severe.  You Must read complete instructions/literature along with all the possible adverse reactions/side effects for all the Medicines you take and that have been prescribed to you. Take any new Medicines after you have completely understood and accpet all the possible adverse reactions/side effects.   Please note  You were cared for by a hospitalist during your hospital stay. If you have any questions about  your discharge medications or the care you received while you were in the hospital after you are discharged, you can call the unit and asked to speak with the hospitalist on call if the hospitalist that took care of you is not available. Once you are discharged, your primary care physician will handle any further medical issues. Please note that NO REFILLS for any discharge medications will be authorized once you are discharged, as it is imperative that you return to your primary care physician (or establish a relationship with a primary care physician if you do not  have one) for your aftercare needs so that they can reassess your need for medications and monitor your lab values.    On the day of Discharge:   VITAL SIGNS:  Blood pressure 125/63, pulse 68, temperature 97.8 F (36.6 C), temperature source Oral, resp. rate 20, height 5' 5"  (1.651 m), weight 95.3 kg (210 lb), SpO2 94 %.  I/O:   Intake/Output Summary (Last 24 hours) at 01/07/16 0954 Last data filed at 01/07/16 0900  Gross per 24 hour  Intake             1130 ml  Output             1250 ml  Net             -120 ml    PHYSICAL EXAMINATION:  GENERAL:  60 y.o.-year-old patient lying in the bed with no acute distress.  EYES: Pupils equal, round, reactive to light and accommodation. No scleral icterus. Extraocular muscles intact.  HEENT: Head atraumatic, normocephalic. Oropharynx and nasopharynx clear.  NECK:  Supple, no jugular venous distention. No thyroid enlargement, no tenderness.  LUNGS: Normal breath sounds bilaterally, no wheezing, rales,rhonchi or crepitation. No use of accessory muscles of respiration.  CARDIOVASCULAR: S1, S2 normal. No murmurs, rubs, or gallops.  ABDOMEN: Soft, non-tender, non-distended. Bowel sounds present. No organomegaly or mass.  EXTREMITIES: No pedal edema, cyanosis, or clubbing.  NEUROLOGIC: Cranial nerves II through XII are intact. Muscle strength 5/5 in all extremities. Sensation intact. Gait  not checked.  PSYCHIATRIC: The patient is alert and oriented x 3.  SKIN: No obvious rash, lesion, or ulcer.   DATA REVIEW:   CBC  Recent Labs Lab 01/06/16 0020  WBC 8.4  HGB 7.5*  HCT 22.3*  PLT 219    Chemistries   Recent Labs Lab 01/05/16 1503 01/06/16 0020  NA 131* 137  K 3.1* 3.2*  CL 99* 108  CO2 21* 22  GLUCOSE 116* 181*  BUN 11 7  CREATININE 0.81 0.64  CALCIUM 8.4* 7.5*  AST 21  --   ALT 17  --   ALKPHOS 90  --   BILITOT 0.2*  --     Cardiac Enzymes  Recent Labs Lab 01/05/16 1503  TROPONINI <0.03    Microbiology Results  Results for orders placed or performed during the hospital encounter of 01/05/16  Blood Culture (routine x 2)     Status: None (Preliminary result)   Collection Time: 01/05/16  3:55 PM  Result Value Ref Range Status   Specimen Description BLOOD RIGHT FOREARM  Final   Special Requests BOTTLES DRAWN AEROBIC AND ANAEROBIC  [place]  Final   Culture NO GROWTH 2 DAYS  Final   Report Status PENDING  Incomplete  Blood Culture (routine x 2)     Status: None (Preliminary result)   Collection Time: 01/05/16  3:55 PM  Result Value Ref Range Status   Specimen Description BLOOD LEFT FOREARM  Final   Special Requests BOTTLES DRAWN AEROBIC AND ANAEROBIC  [place]  Final   Culture NO GROWTH 2 DAYS  Final   Report Status PENDING  Incomplete  Urine culture     Status: Abnormal (Preliminary result)   Collection Time: 01/05/16  5:45 PM  Result Value Ref Range Status   Specimen Description URINE, RANDOM  Final   Special Requests NONE  Final   Culture >=100,000 COLONIES/mL GRAM NEGATIVE RODS (A)  Final   Report Status PENDING  Incomplete    RADIOLOGY:  Dg Chest  2 View  Result Date: 01/05/2016 CLINICAL DATA:  Left-sided chest pain following cortisone shot today EXAM: CHEST  2 VIEW COMPARISON:  04/09/2015 FINDINGS: Cardiac shadow is stable. The lungs are well aerated bilaterally with patchy changes in the right middle lobe consistent with early  infiltrate. Some left basilar atelectasis is noted as well. No bony abnormality is seen. IMPRESSION: Bibasilar changes consistent with atelectasis/ early infiltrate. Electronically Signed   By: Inez Catalina M.D.   On: 01/05/2016 16:29   US Abdomen Limited Ruq  Result Date: 01/05/2016 CLINICAL DATA:  Right upper quadrant abdominal pain. EXAM: US ABDOMEN LIMITED - RIGHT UPPER QUADRANT COMPARISON:  11/26/2009 FINDINGS: Gallbladder: No gallstones or wall thickening visualized. No sonographic Murphy sign noted by sonographer. The prior tiny polyp shown on the ultrasound from 11/26/2009 was not well seen today. Common bile duct: Diameter: 4 mm Liver: No focal lesion identified. Coarse echogenic liver with poor sonic penetration compatible with diffuse hepatic steatosis. IMPRESSION: 1. Coarse echogenic liver with poor sonic penetration compatible with diffuse hepatic steatosis. Otherwise unremarkable. Electronically Signed   By: Van Clines M.D.   On: 01/05/2016 17:02     Management plans discussed with the patient, family and they are in agreement.  CODE STATUS:     Code Status Orders        Start     Ordered   01/05/16 1959  Full code  Continuous     01/05/16 1958    Code Status History    Date Active Date Inactive Code Status Order ID Comments User Context   This patient has a current code status but no historical code status.      TOTAL TIME TAKING CARE OF THIS PATIENT: 33 minutes.    Hower,  Karenann Cai.D on 01/07/2016 at 9:54 AM  Between 7am to 6pm - Pager - [phone]  After 6pm go to [email or web address] - Technical brewer [facility]  Office  [phone]  CC: Primary care physician; SIMS, Daralene Milch, MD

## 2016-01-08 LAB — URINE CULTURE: Culture: >=100000 — AB

## 2016-01-10 LAB — CULTURE, BLOOD (ROUTINE X 2)
CULTURE: NO GROWTH
Culture: NO GROWTH

## 2016-07-11 ENCOUNTER — Encounter: Payer: Medicare Other | Admitting: Pharmacist

## 2016-07-18 ENCOUNTER — Encounter: Payer: Medicare Other | Admitting: Pharmacist

## 2016-09-16 ENCOUNTER — Inpatient Hospital Stay: Payer: Medicare Other

## 2016-09-16 ENCOUNTER — Inpatient Hospital Stay
Admission: EM | Admit: 2016-09-16 | Discharge: 2016-09-19 | DRG: 871 | Disposition: A | Discharge status: Home Health | Payer: Medicare Other | Attending: Internal Medicine | Admitting: Internal Medicine

## 2016-09-16 ENCOUNTER — Emergency Department: Payer: Medicare Other

## 2016-09-16 DIAGNOSIS — I10 Essential (primary) hypertension: Secondary | ICD-10-CM | POA: Diagnosis present

## 2016-09-16 DIAGNOSIS — G4733 Obstructive sleep apnea (adult) (pediatric): Secondary | ICD-10-CM | POA: Diagnosis present

## 2016-09-16 DIAGNOSIS — Z7901 Long term (current) use of anticoagulants: Secondary | ICD-10-CM

## 2016-09-16 DIAGNOSIS — F329 Major depressive disorder, single episode, unspecified: Secondary | ICD-10-CM | POA: Diagnosis present

## 2016-09-16 DIAGNOSIS — I248 Other forms of acute ischemic heart disease: Secondary | ICD-10-CM | POA: Diagnosis present

## 2016-09-16 DIAGNOSIS — Z9989 Dependence on other enabling machines and devices: Secondary | ICD-10-CM

## 2016-09-16 DIAGNOSIS — Z7984 Long term (current) use of oral hypoglycemic drugs: Secondary | ICD-10-CM

## 2016-09-16 DIAGNOSIS — Z79899 Other long term (current) drug therapy: Secondary | ICD-10-CM

## 2016-09-16 DIAGNOSIS — Y95 Nosocomial condition: Secondary | ICD-10-CM | POA: Diagnosis present

## 2016-09-16 DIAGNOSIS — Z23 Encounter for immunization: Secondary | ICD-10-CM | POA: Diagnosis present

## 2016-09-16 DIAGNOSIS — D72829 Elevated white blood cell count, unspecified: Secondary | ICD-10-CM

## 2016-09-16 DIAGNOSIS — R6521 Severe sepsis with septic shock: Secondary | ICD-10-CM | POA: Diagnosis present

## 2016-09-16 DIAGNOSIS — R079 Chest pain, unspecified: Secondary | ICD-10-CM

## 2016-09-16 DIAGNOSIS — G43909 Migraine, unspecified, not intractable, without status migrainosus: Secondary | ICD-10-CM | POA: Diagnosis present

## 2016-09-16 DIAGNOSIS — M1990 Unspecified osteoarthritis, unspecified site: Secondary | ICD-10-CM | POA: Diagnosis present

## 2016-09-16 DIAGNOSIS — Z96652 Presence of left artificial knee joint: Secondary | ICD-10-CM | POA: Diagnosis present

## 2016-09-16 DIAGNOSIS — J441 Chronic obstructive pulmonary disease with (acute) exacerbation: Secondary | ICD-10-CM | POA: Diagnosis present

## 2016-09-16 DIAGNOSIS — D6861 Antiphospholipid syndrome: Secondary | ICD-10-CM | POA: Diagnosis present

## 2016-09-16 DIAGNOSIS — B961 Klebsiella pneumoniae [K. pneumoniae] as the cause of diseases classified elsewhere: Secondary | ICD-10-CM | POA: Diagnosis present

## 2016-09-16 DIAGNOSIS — K219 Gastro-esophageal reflux disease without esophagitis: Secondary | ICD-10-CM | POA: Diagnosis present

## 2016-09-16 DIAGNOSIS — J9600 Acute respiratory failure, unspecified whether with hypoxia or hypercapnia: Secondary | ICD-10-CM | POA: Diagnosis present

## 2016-09-16 DIAGNOSIS — N3001 Acute cystitis with hematuria: Secondary | ICD-10-CM

## 2016-09-16 DIAGNOSIS — D649 Anemia, unspecified: Secondary | ICD-10-CM | POA: Diagnosis present

## 2016-09-16 DIAGNOSIS — Z86711 Personal history of pulmonary embolism: Secondary | ICD-10-CM

## 2016-09-16 DIAGNOSIS — Z716 Tobacco abuse counseling: Secondary | ICD-10-CM

## 2016-09-16 DIAGNOSIS — B9689 Other specified bacterial agents as the cause of diseases classified elsewhere: Secondary | ICD-10-CM

## 2016-09-16 DIAGNOSIS — N3090 Cystitis, unspecified without hematuria: Secondary | ICD-10-CM

## 2016-09-16 DIAGNOSIS — J189 Pneumonia, unspecified organism: Secondary | ICD-10-CM | POA: Diagnosis present

## 2016-09-16 DIAGNOSIS — Z452 Encounter for adjustment and management of vascular access device: Secondary | ICD-10-CM

## 2016-09-16 DIAGNOSIS — N390 Urinary tract infection, site not specified: Secondary | ICD-10-CM | POA: Diagnosis present

## 2016-09-16 DIAGNOSIS — E871 Hypo-osmolality and hyponatremia: Secondary | ICD-10-CM | POA: Diagnosis present

## 2016-09-16 DIAGNOSIS — F17210 Nicotine dependence, cigarettes, uncomplicated: Secondary | ICD-10-CM | POA: Diagnosis present

## 2016-09-16 DIAGNOSIS — R4182 Altered mental status, unspecified: Secondary | ICD-10-CM | POA: Diagnosis present

## 2016-09-16 DIAGNOSIS — Z90710 Acquired absence of both cervix and uterus: Secondary | ICD-10-CM

## 2016-09-16 DIAGNOSIS — E1165 Type 2 diabetes mellitus with hyperglycemia: Secondary | ICD-10-CM | POA: Diagnosis present

## 2016-09-16 DIAGNOSIS — J440 Chronic obstructive pulmonary disease with acute lower respiratory infection: Secondary | ICD-10-CM | POA: Diagnosis present

## 2016-09-16 DIAGNOSIS — R531 Weakness: Secondary | ICD-10-CM

## 2016-09-16 DIAGNOSIS — A419 Sepsis, unspecified organism: Secondary | ICD-10-CM | POA: Diagnosis present

## 2016-09-16 DIAGNOSIS — E876 Hypokalemia: Secondary | ICD-10-CM | POA: Diagnosis present

## 2016-09-16 DIAGNOSIS — J9601 Acute respiratory failure with hypoxia: Secondary | ICD-10-CM | POA: Diagnosis present

## 2016-09-16 DIAGNOSIS — Z9981 Dependence on supplemental oxygen: Secondary | ICD-10-CM

## 2016-09-16 DIAGNOSIS — T380X5A Adverse effect of glucocorticoids and synthetic analogues, initial encounter: Secondary | ICD-10-CM | POA: Diagnosis present

## 2016-09-16 DIAGNOSIS — Z8673 Personal history of transient ischemic attack (TIA), and cerebral infarction without residual deficits: Secondary | ICD-10-CM

## 2016-09-16 DIAGNOSIS — I9589 Other hypotension: Secondary | ICD-10-CM

## 2016-09-16 DIAGNOSIS — M5030 Other cervical disc degeneration, unspecified cervical region: Secondary | ICD-10-CM | POA: Diagnosis present

## 2016-09-16 DIAGNOSIS — R778 Other specified abnormalities of plasma proteins: Secondary | ICD-10-CM

## 2016-09-16 DIAGNOSIS — R7989 Other specified abnormal findings of blood chemistry: Secondary | ICD-10-CM | POA: Diagnosis present

## 2016-09-16 DIAGNOSIS — Z86718 Personal history of other venous thrombosis and embolism: Secondary | ICD-10-CM

## 2016-09-16 HISTORY — DX: Essential (primary) hypertension: I10

## 2016-09-16 HISTORY — DX: Hypotension, unspecified: I95.9

## 2016-09-16 LAB — CBC WITH DIFFERENTIAL/PLATELET
Basophils Absolute: 0.1 10*3/uL (ref 0–0.1)
Basophils Relative: 1 %
EOS ABS: 0 10*3/uL (ref 0–0.7)
Eosinophils Relative: 0 %
HEMATOCRIT: 33.1 % — AB (ref 35.0–47.0)
HEMOGLOBIN: 10.7 g/dL — AB (ref 12.0–16.0)
LYMPHS ABS: 1.7 10*3/uL (ref 1.0–3.6)
Lymphocytes Relative: 12 %
MCH: 24 pg — AB (ref 26.0–34.0)
MCHC: 32.3 g/dL (ref 32.0–36.0)
MCV: 74.4 fL — ABNORMAL LOW (ref 80.0–100.0)
MONO ABS: 0.1 10*3/uL — AB (ref 0.2–0.9)
MONOS PCT: 1 %
Neutro Abs: 12.5 10*3/uL — ABNORMAL HIGH (ref 1.4–6.5)
Neutrophils Relative %: 86 %
Platelets: 180 10*3/uL (ref 150–440)
RBC: 4.44 MIL/uL (ref 3.80–5.20)
RDW: 24.8 % — ABNORMAL HIGH (ref 11.5–14.5)
WBC: 14.4 10*3/uL — ABNORMAL HIGH (ref 3.6–11.0)

## 2016-09-16 LAB — URINALYSIS, COMPLETE (UACMP) WITH MICROSCOPIC
BILIRUBIN URINE: NEGATIVE
Glucose, UA: NEGATIVE mg/dL
Ketones, ur: NEGATIVE mg/dL
Nitrite: NEGATIVE
PROTEIN: NEGATIVE mg/dL
Specific Gravity, Urine: 1.014 (ref 1.005–1.030)
pH: 5 (ref 5.0–8.0)

## 2016-09-16 LAB — TROPONIN I
TROPONIN I: 0.41 ng/mL — AB (ref <0.03)
TROPONIN I: 0.29 ng/mL — AB (ref <0.03)
Troponin I: 0.15 ng/mL (ref <0.03)

## 2016-09-16 LAB — COMPREHENSIVE METABOLIC PANEL
ALBUMIN: 3.1 g/dL — AB (ref 3.5–5.0)
ALT: 25 U/L (ref 14–54)
AST: 72 U/L — AB (ref 15–41)
Alkaline Phosphatase: 97 U/L (ref 38–126)
Anion gap: 8 (ref 5–15)
BUN: 23 mg/dL — ABNORMAL HIGH (ref 6–20)
CHLORIDE: 101 mmol/L (ref 101–111)
CO2: 24 mmol/L (ref 22–32)
CREATININE: 0.98 mg/dL (ref 0.44–1.00)
Calcium: 8.4 mg/dL — ABNORMAL LOW (ref 8.9–10.3)
GFR calc non Af Amer: >60 mL/min (ref >60)
GLUCOSE: 108 mg/dL — AB (ref 65–99)
Potassium: 2.9 mmol/L — ABNORMAL LOW (ref 3.5–5.1)
SODIUM: 133 mmol/L — AB (ref 135–145)
Total Bilirubin: 0.6 mg/dL (ref 0.3–1.2)
Total Protein: 6.9 g/dL (ref 6.5–8.1)

## 2016-09-16 LAB — PROTIME-INR
INR: 2.12
PROTHROMBIN TIME: 24.1 s — AB (ref 11.4–15.2)

## 2016-09-16 LAB — GLUCOSE, CAPILLARY
GLUCOSE-CAPILLARY: 126 mg/dL — AB (ref 65–99)
GLUCOSE-CAPILLARY: 203 mg/dL — AB (ref 65–99)
GLUCOSE-CAPILLARY: 184 mg/dL — AB (ref 65–99)
Glucose-Capillary: 153 mg/dL — ABNORMAL HIGH (ref 65–99)

## 2016-09-16 LAB — LACTIC ACID, PLASMA
Lactic Acid, Venous: 1.7 mmol/L (ref 0.5–1.9)
Lactic Acid, Venous: 1.3 mmol/L (ref 0.5–1.9)

## 2016-09-16 LAB — MRSA PCR SCREENING: MRSA by PCR: NEGATIVE

## 2016-09-16 LAB — POTASSIUM: Potassium: 3.4 mmol/L — ABNORMAL LOW (ref 3.5–5.1)

## 2016-09-16 LAB — MAGNESIUM
MAGNESIUM: 1.3 mg/dL — AB (ref 1.7–2.4)
Magnesium: 2.1 mg/dL (ref 1.7–2.4)

## 2016-09-16 LAB — BRAIN NATRIURETIC PEPTIDE: B Natriuretic Peptide: 263 pg/mL — ABNORMAL HIGH (ref 0.0–100.0)

## 2016-09-16 MED ORDER — IOPAMIDOL (ISOVUE-300) INJECTION 61%
75.0000 mL | Freq: Once | INTRAVENOUS | Status: AC | PRN
  Administered 2016-09-16: 75 mL via INTRAVENOUS

## 2016-09-16 MED ORDER — SODIUM CHLORIDE 0.9% FLUSH: 10.0000 mL | INTRAVENOUS | Status: DC | PRN

## 2016-09-16 MED ORDER — SODIUM CHLORIDE 0.9% FLUSH
10.0000 mL | Freq: Two times a day (BID) | INTRAVENOUS | Status: DC
  Administered 2016-09-16 – 2016-09-19 (×5): 10 mL

## 2016-09-16 MED ORDER — PREGABALIN 75 MG PO CAPS
150.0000 mg | ORAL_CAPSULE | Freq: Three times a day (TID) | ORAL | Status: DC
  Administered 2016-09-16 – 2016-09-19 (×10): 150 mg via ORAL
  Filled 2016-09-16 (×10): qty 2

## 2016-09-16 MED ORDER — ACETAMINOPHEN 650 MG RE SUPP: 650.0000 mg | Freq: Four times a day (QID) | RECTAL | Status: DC | PRN

## 2016-09-16 MED ORDER — SODIUM CHLORIDE 0.9 % IV BOLUS (SEPSIS)
1000.0000 mL | Freq: Once | INTRAVENOUS | Status: AC
  Administered 2016-09-16: 1000 mL via INTRAVENOUS

## 2016-09-16 MED ORDER — MAGNESIUM SULFATE 2 GM/50ML IV SOLN
2.0000 g | Freq: Once | INTRAVENOUS | Status: AC
  Administered 2016-09-16: 2 g via INTRAVENOUS
  Filled 2016-09-16: qty 50

## 2016-09-16 MED ORDER — INSULIN ASPART 100 UNIT/ML ~~LOC~~ SOLN
0.0000 [IU] | Freq: Three times a day (TID) | SUBCUTANEOUS | Status: DC
  Administered 2016-09-16: 3 [IU] via SUBCUTANEOUS
  Administered 2016-09-16 – 2016-09-17 (×2): 5 [IU] via SUBCUTANEOUS
  Administered 2016-09-17: 3 [IU] via SUBCUTANEOUS
  Administered 2016-09-17 – 2016-09-18 (×2): 5 [IU] via SUBCUTANEOUS
  Administered 2016-09-18: 3 [IU] via SUBCUTANEOUS
  Administered 2016-09-18: 5 [IU] via SUBCUTANEOUS
  Administered 2016-09-19 (×3): 3 [IU] via SUBCUTANEOUS
  Filled 2016-09-16 (×11): qty 1

## 2016-09-16 MED ORDER — VANCOMYCIN HCL IN DEXTROSE 1-5 GM/200ML-% IV SOLN
1000.0000 mg | Freq: Two times a day (BID) | INTRAVENOUS | Status: DC
  Administered 2016-09-16 – 2016-09-17 (×3): 1000 mg via INTRAVENOUS
  Filled 2016-09-16 (×4): qty 200

## 2016-09-16 MED ORDER — SODIUM CHLORIDE 0.9 % IV BOLUS (SEPSIS)
1000.0000 mL | Freq: Once | INTRAVENOUS | Status: AC
Start: 2016-09-16 — End: 2016-09-16
  Administered 2016-09-16: 1000 mL via INTRAVENOUS

## 2016-09-16 MED ORDER — POTASSIUM CHLORIDE 10 MEQ/50ML IV SOLN
10.0000 meq | INTRAVENOUS | Status: DC | PRN
  Administered 2016-09-16 – 2016-09-17 (×2): 10 meq via INTRAVENOUS
  Filled 2016-09-16 (×4): qty 50

## 2016-09-16 MED ORDER — NOREPINEPHRINE BITARTRATE 1 MG/ML IV SOLN
0.0000 ug/min | INTRAVENOUS | Status: DC
  Administered 2016-09-16: 2 ug/min via INTRAVENOUS
  Filled 2016-09-16: qty 4

## 2016-09-16 MED ORDER — DEXTROSE 5 % IV SOLN
2.0000 g | Freq: Three times a day (TID) | INTRAVENOUS | Status: DC
  Administered 2016-09-16 – 2016-09-19 (×10): 2 g via INTRAVENOUS
  Filled 2016-09-16 (×14): qty 2

## 2016-09-16 MED ORDER — ONDANSETRON HCL 4 MG PO TABS: 4.0000 mg | ORAL_TABLET | Freq: Four times a day (QID) | ORAL | Status: DC | PRN

## 2016-09-16 MED ORDER — DEXTROMETHORPHAN POLISTIREX ER 30 MG/5ML PO SUER
30.0000 mg | Freq: Two times a day (BID) | ORAL | Status: AC
  Administered 2016-09-16 – 2016-09-19 (×6): 30 mg via ORAL
  Filled 2016-09-16 (×6): qty 5

## 2016-09-16 MED ORDER — ENOXAPARIN SODIUM 100 MG/ML ~~LOC~~ SOLN
90.0000 mg | Freq: Two times a day (BID) | SUBCUTANEOUS | Status: DC
  Administered 2016-09-16 – 2016-09-18 (×5): 90 mg via SUBCUTANEOUS
  Filled 2016-09-16 (×6): qty 1

## 2016-09-16 MED ORDER — INSULIN ASPART 100 UNIT/ML ~~LOC~~ SOLN
0.0000 [IU] | Freq: Every day | SUBCUTANEOUS | Status: DC
  Administered 2016-09-17: 3 [IU] via SUBCUTANEOUS
  Filled 2016-09-16: qty 1

## 2016-09-16 MED ORDER — CEFEPIME HCL 2 G IJ SOLR
2.0000 g | Freq: Once | INTRAMUSCULAR | Status: AC
  Administered 2016-09-16: 2 g via INTRAVENOUS
  Filled 2016-09-16: qty 2

## 2016-09-16 MED ORDER — ACETAMINOPHEN 325 MG PO TABS
650.0000 mg | ORAL_TABLET | Freq: Four times a day (QID) | ORAL | Status: DC | PRN
  Administered 2016-09-17 – 2016-09-19 (×4): 650 mg via ORAL
  Filled 2016-09-16 (×4): qty 2

## 2016-09-16 MED ORDER — DM-GUAIFENESIN ER 30-600 MG PO TB12: 1.0000 | ORAL_TABLET | Freq: Two times a day (BID) | ORAL | Status: DC

## 2016-09-16 MED ORDER — IPRATROPIUM-ALBUTEROL 0.5-2.5 (3) MG/3ML IN SOLN
3.0000 mL | Freq: Once | RESPIRATORY_TRACT | Status: AC
  Administered 2016-09-16: 3 mL via RESPIRATORY_TRACT
  Filled 2016-09-16: qty 3

## 2016-09-16 MED ORDER — LIDOCAINE HCL 2 % IJ SOLN
5.0000 mL | Freq: Once | INTRAMUSCULAR | Status: AC
  Administered 2016-09-16: 100 mg via INTRADERMAL
  Filled 2016-09-16 (×2): qty 10

## 2016-09-16 MED ORDER — METHYLPREDNISOLONE SODIUM SUCC 125 MG IJ SOLR
60.0000 mg | Freq: Four times a day (QID) | INTRAMUSCULAR | Status: DC
  Administered 2016-09-16: 60 mg via INTRAVENOUS
  Filled 2016-09-16: qty 2

## 2016-09-16 MED ORDER — METHYLPREDNISOLONE SODIUM SUCC 125 MG IJ SOLR
60.0000 mg | Freq: Four times a day (QID) | INTRAMUSCULAR | Status: DC
  Administered 2016-09-16 – 2016-09-19 (×13): 60 mg via INTRAVENOUS
  Filled 2016-09-16 (×13): qty 2

## 2016-09-16 MED ORDER — BUSPIRONE HCL 10 MG PO TABS
20.0000 mg | ORAL_TABLET | Freq: Three times a day (TID) | ORAL | Status: DC
  Administered 2016-09-16 – 2016-09-19 (×10): 20 mg via ORAL
  Filled 2016-09-16 (×13): qty 2

## 2016-09-16 MED ORDER — ONDANSETRON HCL 4 MG/2ML IJ SOLN: 4.0000 mg | Freq: Four times a day (QID) | INTRAMUSCULAR | Status: DC | PRN

## 2016-09-16 MED ORDER — SODIUM CHLORIDE 0.9 % IV SOLN
INTRAVENOUS | Status: DC
  Administered 2016-09-16 – 2016-09-17 (×2): via INTRAVENOUS

## 2016-09-16 MED ORDER — GUAIFENESIN ER 600 MG PO TB12
600.0000 mg | ORAL_TABLET | Freq: Two times a day (BID) | ORAL | Status: DC
  Administered 2016-09-17 – 2016-09-19 (×5): 600 mg via ORAL
  Filled 2016-09-16 (×6): qty 1

## 2016-09-16 MED ORDER — ORAL CARE MOUTH RINSE
15.0000 mL | Freq: Two times a day (BID) | OROMUCOSAL | Status: DC
  Administered 2016-09-16 – 2016-09-17 (×3): 15 mL via OROMUCOSAL

## 2016-09-16 MED ORDER — PNEUMOCOCCAL VAC POLYVALENT 25 MCG/0.5ML IJ INJ
0.5000 mL | INJECTION | INTRAMUSCULAR | Status: AC
  Administered 2016-09-17: 0.5 mL via INTRAMUSCULAR
  Filled 2016-09-16 (×2): qty 0.5

## 2016-09-16 MED ORDER — VANCOMYCIN HCL IN DEXTROSE 1-5 GM/200ML-% IV SOLN
1000.0000 mg | Freq: Once | INTRAVENOUS | Status: AC
  Administered 2016-09-16: 1000 mg via INTRAVENOUS
  Filled 2016-09-16: qty 200

## 2016-09-16 MED ORDER — IPRATROPIUM-ALBUTEROL 0.5-2.5 (3) MG/3ML IN SOLN
3.0000 mL | Freq: Four times a day (QID) | RESPIRATORY_TRACT | Status: DC
  Administered 2016-09-16 – 2016-09-19 (×12): 3 mL via RESPIRATORY_TRACT
  Filled 2016-09-16 (×12): qty 3

## 2016-09-16 MED ORDER — POTASSIUM CHLORIDE 10 MEQ/100ML IV SOLN
10.0000 meq | INTRAVENOUS | Status: AC
  Administered 2016-09-16 (×4): 10 meq via INTRAVENOUS
  Filled 2016-09-16 (×4): qty 100

## 2016-09-16 NOTE — Procedures (Signed)
Central Venous Catheter Insertion Procedure Note Cathy Fox [id number] [birth year]  Procedure: Insertion of Central Venous Catheter Indications: Assessment of intravascular volume, Drug and/or fluid administration and Frequent blood sampling  Procedure Details Consent: Risks of procedure as well as the alternatives and risks of each were explained to the (patient/caregiver).  Consent for procedure obtained. Time Out: Verified patient identification, verified procedure, site/side was marked, verified correct patient position, special equipment/implants available, medications/allergies/relevent history reviewed, required imaging and test results available.  Performed  Maximum sterile technique was used including antiseptics, cap, gloves, gown, hand hygiene, mask and sheet. Skin prep: Chlorhexidine; local anesthetic administered A antimicrobial bonded/coated triple lumen catheter was placed in the left internal jugular vein using the Seldinger technique.  Evaluation Blood flow good Complications: No apparent complications Patient did tolerate procedure well. Chest X-ray ordered to verify placement.  CXR: pending.  Left internal jugular central line placed utilizing ultrasound no complications noted during or following procedure.  Cathy Fox, AGNP  Pulmonary/Critical Care Pager [phone] (please enter 7 digits) PCCM Consult Pager [phone] (please enter 7 digits)

## 2016-09-16 NOTE — ED Notes (Signed)
Admitting md at bedside

## 2016-09-16 NOTE — Progress Notes (Signed)
Replace magnesium per Dr Lonn Georgia

## 2016-09-16 NOTE — ED Triage Notes (Signed)
Per EMS pt has had cough and congestion for the last 3 days and developing yest pt reports neck swelling and pain with movement. Pt is on non rebreather at this time with dyspnea with rest. Pt also reports fever of 100.8 at 1am and took one tylenol, pt currently 98.4 on arrival. Pt reports being on warfarin for hx of DVT. Pt denies hx of COPD, CHF or having to wear oxygen at home. Pt is alert and able to answer questions at this time.

## 2016-09-16 NOTE — Progress Notes (Signed)
Pharmacy Antibiotic Note  Cathy Fox is a 61 y.o. female admitted on 09/16/2016 with pneumonia.  Pharmacy has been consulted for vanc/zosyn dosing.  Plan: Patient received vanc 1g and cefepime 2g IV x 1 in ED  Will continue w/ vanc 1.25g IV q12h w/ 6 hour stack dose. Will draw a vanc trough 6/23 @ 1100 prior to 4th dose. Ke 0.0622 T1/2 12 hours  Will start cefepime 2g IV q8h  Height: 5\' 7"  (170.2 cm) Weight: 200 lb (90.7 kg) IBW/kg (Calculated) : 61.6  Temp (24hrs), Avg:98.4 F (36.9 C), Min:98.4 F (36.9 C), Max:98.4 F (36.9 C)   Recent Labs Lab 09/16/16 0445  WBC 14.4*  CREATININE 0.98  LATICACIDVEN 1.7    Estimated Creatinine Clearance: 69.7 mL/min (by C-G formula based on SCr of 0.98 mg/dL).    No Known Allergies   Thank you for allowing pharmacy to be a part of this patient's care.  09/18/16, PharmD, BCPS Clinical Pharmacist 09/16/2016

## 2016-09-16 NOTE — Progress Notes (Signed)
Chart reviewed and case discussed with admitting physician  61 year old female presents with acute respiratory failure due to multilobar pneumonia and septic shock Continue current management Intensivist consult

## 2016-09-16 NOTE — ED Provider Notes (Signed)
[facility] Emergency Department Provider Note   ____________________________________________   First MD Initiated Contact with Patient 09/16/16 [phone]     (approximate)  I have reviewed the triage vital signs and the nursing notes.   HISTORY  Chief Complaint Cough and Weakness    HPI Cathy Fox is a 61 y.o. female who comes into the hospital today after not feeling well for multiple days. The patient has had some cough and congestion with green sputum production.the patient's sister came over today and noticed a lump on the back of her neck. She reports that it got bigger and bigger and then as the patient had more difficulty breathing she called EMS. The patient did have a temperature to 100.8 for which her sister gave her Tylenol. When the fire department arrived they report that the patient's sats were in the high 70s to low 80s. She reports that she does have some neck pain.the patient does have a history of antiphospholipid syndrome and did have a DVT 10 years ago. She denies any chest pain   Past Medical History:  Diagnosis Date  . Anemia   . Anxiety   . Arthritis   . DDD (degenerative disc disease), cervical   . Depression   . Diabetes mellitus without complication (HCC)   . DVT (deep venous thrombosis) (HCC)   . GERD (gastroesophageal reflux disease)   . Headache   . History of pulmonary embolus (PE) 2005  . Hx of antiphospholipid antibody syndrome   . Hypertension   . Hypotension   . Pancreatitis [address] [place]  . Shortness of breath dyspnea   . Sleep apnea   . Vertigo     Patient Active Problem List   Diagnosis Date Noted  . Acute respiratory failure (HCC) 09/16/2016  . Pneumonia 01/05/2016    Past Surgical History:  Procedure Laterality Date  . ABDOMINAL HYSTERECTOMY    . DILATION AND CURETTAGE OF UTERUS    . JOINT REPLACEMENT Left    Total knee Replacement  . NASAL SEPTOPLASTY W/ TURBINOPLASTY Bilateral  12/16/2014   Procedure: NASAL SEPTOPLASTY WITH BILATERAL TURBINATE REDUCTION;  Surgeon: Linus Salmons, MD;  Location: [facility] ORS;  Service: ENT;  Laterality: Bilateral;    Prior to Admission medications   Medication Sig Start Date End Date Taking? Authorizing Provider  atorvastatin (LIPITOR) 80 MG tablet Take 80 mg by mouth daily.   Yes [provider]  busPIRone (BUSPAR) 10 MG tablet Take 20 mg by mouth 3 (three) times daily.   Yes [provider]  diazepam (VALIUM) 5 MG tablet Take 7.5 mg by mouth 3 (three) times daily. for anxiety 04/03/15  Yes [provider]  docusate sodium (STOOL SOFTENER) 100 MG capsule Take 100 mg by mouth 2 (two) times daily.   Yes [provider]  esomeprazole (NEXIUM) 40 MG capsule Take 40 mg by mouth daily at 12 noon.   Yes [provider]  ferrous sulfate 325 (65 FE) MG tablet Take 325 mg by mouth daily with breakfast.   Yes [provider]  fluticasone (FLONASE) 50 MCG/ACT nasal spray Place 1 spray into both nostrils daily. 01/08/16  Yes Hower, Cletis [place], MD  folic acid (FOLVITE) 1 MG tablet Take 1 mg by mouth 2 (two) times daily.    Yes [provider]  furosemide (LASIX) 20 MG tablet Take 20-40 mg by mouth See admin instructions. Take 2 tablets (40 mg) by mouth every morning and Take 20  mg by mouth in the evening.   Yes [provider]  metFORMIN (GLUCOPHAGE) 1000 MG tablet Take 1,000 mg by mouth 2 (two) times daily with a meal.   Yes [provider]  methotrexate 50 MG/2ML injection Inject 0.8 mLs into the skin once a week. Use on Wednesday. 08/13/15  Yes [provider]  metoprolol tartrate (LOPRESSOR) 25 MG tablet Take 25 mg by mouth 2 (two) times daily.   Yes [provider]  morphine (MS CONTIN) 15 MG 12 hr tablet Take 15 mg by mouth every 12 (twelve) hours.   Yes [provider]  nicotine (NICODERM CQ - DOSED IN MG/24 HOURS) 21 mg/24hr patch Place 21 mg onto  the skin daily.   Yes [provider]  omeprazole (PRILOSEC) 40 MG capsule Take 40 mg by mouth daily.   Yes [provider]  pregabalin (LYRICA) 150 MG capsule Take 150 mg by mouth 3 (three) times daily.   Yes [provider]  PROAIR HFA 108 (90 Base) MCG/ACT inhaler INHALE 2 PUFFS PO Q 6 TO 8 H PRN FOR WHEEZING 03/19/15  Yes [provider]  promethazine (PHENERGAN) 25 MG tablet Take 25 mg by mouth every 6 (six) hours as needed for nausea or vomiting.   Yes [provider]  tiotropium (SPIRIVA) 18 MCG inhalation capsule Place 1 capsule (18 mcg total) into inhaler and inhale daily. 01/07/16  Yes Hower, Cletis [place], MD  tiZANidine (ZANAFLEX) 4 MG tablet Take 4 mg by mouth 4 (four) times daily -  with meals and at bedtime.    Yes [provider]  warfarin (COUMADIN) 10 MG tablet Take 5-10 mg by mouth See admin instructions. Take5 mg tablet by mouth every evening on Monday, Wednesday, Saturday. Take 10 mg tablet by mouth on Tuesday, Thursday, Friday and Sunday   Yes [provider]    Allergies Patient has no known allergies.  Family History  Problem Relation Age of Onset  . Heart disease Mother   . Heart disease Father   . Kidney cancer Father   . Colon cancer Father     Social History Social History  Substance Use Topics  . Smoking status: Current Every Day Smoker    Packs/day: 1.00    Types: Cigarettes  . Smokeless tobacco: Never Used  . Alcohol use No    Review of Systems  Constitutional:  fever/chills Eyes: No visual changes. ENT: No sore throat. Cardiovascular: Denies chest pain. Respiratory: cough and shortness of breath. Gastrointestinal: No abdominal pain.  No nausea, no vomiting.  No diarrhea.  No constipation. Genitourinary: Negative for dysuria. Musculoskeletal: Negative for back pain. Skin: Negative for rash. Neurological: weakness   ____________________________________________   PHYSICAL  EXAM:  VITAL SIGNS: ED Triage Vitals  Enc Vitals Group     BP 09/16/16 0434 (!) 112/95     Pulse Rate 09/16/16 0434 72     Resp 09/16/16 0434 11     Temp 09/16/16 0434 98.4 F (36.9 C)     Temp Source 09/16/16 0434 Oral     SpO2 09/16/16 0434 98 %     Weight 09/16/16 0439 200 lb (90.7 kg)     Height 09/16/16 0439 5\' 7"  (1.702 m)     Head Circumference --      Peak Flow --      Pain Score 09/16/16 0432 7     Pain Loc --      Pain Edu? --  Excl. in GC? --     Constitutional: Alert and somnolent Ill appearing and in moderate respiratory distress. Eyes: Conjunctivae are normal. PERRL. EOMI. Head: Atraumatic. Nose: No congestion/rhinnorhea. Mouth/Throat: Mucous membranes are dry.  Oropharynx non-erythematous. Neck: posterior neck tenderness to palpation Cardiovascular: Normal rate, regular rhythm. Grossly normal heart sounds.  Good peripheral circulation. Respiratory: Increased respiratory effort.  No retractions. Wheezes in all lung fields Gastrointestinal: Soft and nontender. No distention. Positive bowel sounds Musculoskeletal: No lower extremity tenderness nor edema.   Neurologic:  Normal speech and language.  Skin:  Skin is warm, dry and intact.  Psychiatric: Mood and affect are normal.   ____________________________________________   LABS (all labs ordered are listed, but only abnormal results are displayed)  Labs Reviewed  COMPREHENSIVE METABOLIC PANEL - Abnormal; Notable for the following:       Result Value   Sodium 133 (*)    Potassium 2.9 (*)    Glucose, Bld 108 (*)    BUN 23 (*)    Calcium 8.4 (*)    Albumin 3.1 (*)    AST 72 (*)    All other components within normal limits  CBC WITH DIFFERENTIAL/PLATELET - Abnormal; Notable for the following:    WBC 14.4 (*)    Hemoglobin 10.7 (*)    HCT 33.1 (*)    MCV 74.4 (*)    MCH 24.0 (*)    RDW 24.8 (*)    Neutro Abs 12.5 (*)    Monocytes Absolute 0.1 (*)    All other components within normal limits   TROPONIN I - Abnormal; Notable for the following:    Troponin I 0.41 (*)    All other components within normal limits  BRAIN NATRIURETIC PEPTIDE - Abnormal; Notable for the following:    B Natriuretic Peptide 263.0 (*)    All other components within normal limits  BLOOD GAS, VENOUS - Abnormal; Notable for the following:    pO2, Ven 52.0 (*)    Acid-base deficit 2.9 (*)    All other components within normal limits  URINALYSIS, COMPLETE (UACMP) WITH MICROSCOPIC - Abnormal; Notable for the following:    Color, Urine YELLOW (*)    APPearance CLOUDY (*)    Hgb urine dipstick SMALL (*)    Leukocytes, UA LARGE (*)    Bacteria, UA MANY (*)    Squamous Epithelial / LPF 0-5 (*)    All other components within normal limits  PROTIME-INR - Abnormal; Notable for the following:    Prothrombin Time 24.1 (*)    All other components within normal limits  CULTURE, BLOOD (ROUTINE X 2)  CULTURE, BLOOD (ROUTINE X 2)  URINE CULTURE  LACTIC ACID, PLASMA  LACTIC ACID, PLASMA  TROPONIN I  TROPONIN I  TROPONIN I   ____________________________________________  EKG  ED ECG REPORT I, Rebecka Apley, the attending physician, personally viewed and interpreted this ECG.   Date: 09/16/2016  EKG Time: 441  Rate: 73  Rhythm: normal sinus rhythm  Axis: normal  Intervals:prolonged QTC  ST&T Change: none  ____________________________________________  RADIOLOGY  Ct Head Wo Contrast  Result Date: 09/16/2016 CLINICAL DATA:  Fall.  Neck swelling, on anticoagulation.  Fever. EXAM: CT HEAD WITHOUT CONTRAST TECHNIQUE: Contiguous axial images were obtained from the base of the skull through the vertex without intravenous contrast. COMPARISON:  None. FINDINGS: Brain: No evidence of acute infarction, hemorrhage, hydrocephalus, extra-axial collection or mass lesion/mass effect. Vascular: Atherosclerosis of skullbase vasculature without hyperdense vessel or abnormal calcification. Skull:  No skull fracture or  focal lesion. Sinuses/Orbits: Mild mucosal thickening of the ethmoid air cells. No fluid levels. Mastoid air cells are clear. Other: None. IMPRESSION: No acute intracranial abnormality. Electronically Signed   By: Rubye Oaks M.D.   On: 09/16/2016 06:17   Ct Soft Tissue Neck W Contrast  Result Date: 09/16/2016 CLINICAL DATA:  Initial evaluation for acute trauma, fall on Coumadin, neck swelling. EXAM: CT NECK WITH CONTRAST TECHNIQUE: Multidetector CT imaging of the neck was performed using the standard protocol following the bolus administration of intravenous contrast. CONTRAST:  75mL ISOVUE-300 IOPAMIDOL (ISOVUE-300) INJECTION 61% COMPARISON:  None available. FINDINGS: Pharynx and larynx: Gaseous distension of the oral cavity, likely related to the presence of a non rebreather. Oral cavity otherwise within normal limits without mass lesion or loculated fluid collection. Patient is edentulous. Palatine tonsils symmetric and normal. Parapharyngeal fat preserved. Nasopharynx normal. Retropharyngeal soft tissues within normal limits. Epiglottis normal. Vallecula largely clear. Remainder of the hypopharynx and supraglottic larynx within normal limits. True cords symmetric and within normal limits. Possible layering secretions within the subglottic trachea near the carina. Subglottic airway otherwise clear. Salivary glands: Salivary glands including the parotid and submandibular glands are normal. Thyroid: Subcentimeter hypodense nodule noted within the right thyroid lobe, of doubtful significance. Thyroid otherwise unremarkable. Lymph nodes: No pathologically enlarged lymph nodes identified within the neck. Scattered subcentimeter mediastinal lymph nodes noted. Vascular: Normal intravascular enhancement seen throughout the neck. Atheromatous plaque about the right carotid bifurcation. Mild medial is a shin of the carotid artery's into the retropharyngeal space noted. Limited intracranial: Unremarkable.  Visualized orbits: Visualized globes and orbital soft tissues within normal limits. Mastoids and visualized paranasal sinuses: Scattered mucosal thickening within the right maxillary sinus and visualized ethmoidal air cells. Visualized paranasal sinuses otherwise clear. Visualize mastoids and middle ear cavities are clear. Skeleton: No acute osseous abnormality. No worrisome lytic or blastic osseous lesions. Moderate degenerative spondylolysis present at C5-6 and C6-7. Upper chest: Multifocal nodular and patchy opacities within the visualized upper lobes, left greater than right patchy involvement within the superior segment left lower lobe. Findings concerning for possible multifocal infection. Other: No other acute abnormality within the neck. No discrete soft tissue hematoma or evidence for traumatic injury. IMPRESSION: 1. No acute abnormality identified within the neck. No evidence for traumatic injury status post recent fall. 2. Multifocal nodular and ground-glass opacities within the visualized lungs, left greater than right, concerning for multifocal pneumonia/infection. 3. Layering secretions within the subglottic trachea. Finding suggests that this patient may be at risk for aspiration. Electronically Signed   By: Rise Mu M.D.   On: 09/16/2016 06:55   Dg Chest Port 1 View  Result Date: 09/16/2016 CLINICAL DATA:  Cough and hypoxia. EXAM: PORTABLE CHEST 1 VIEW COMPARISON:  01/05/2016 FINDINGS: The lungs are hyperinflated. Central bronchitic changes are stable. Ill-defined heterogeneous bibasilar opacities are unchanged from prior exam. Heart size is unchanged at the upper limits of normal. Unchanged mediastinal contours. No pleural fluid. No pneumothorax. No acute osseous abnormality. IMPRESSION: Chronic hyperinflation and bronchial thickening. Heterogeneous bibasilar opacities are unchanged from prior exam, may be atelectasis or scarring. Electronically Signed   By: Rubye Oaks M.D.    On: 09/16/2016 05:21    ____________________________________________   PROCEDURES  Procedure(s) performed: None  Procedures  Critical Care performed: Yes, see critical care note(s)   CRITICAL CARE Performed by: Lucrezia Europe P   Total critical care time: 45 minutes  Critical care time was exclusive of separately billable  procedures and treating other patients.  Critical care was necessary to treat or prevent imminent or life-threatening deterioration.  Critical care was time spent personally by me on the following activities: development of treatment plan with patient and/or surrogate as well as nursing, discussions with consultants, evaluation of patient's response to treatment, examination of patient, obtaining history from patient or surrogate, ordering and performing treatments and interventions, ordering and review of laboratory studies, ordering and review of radiographic studies, pulse oximetry and re-evaluation of patient's condition.   ____________________________________________   INITIAL IMPRESSION / ASSESSMENT AND PLAN / ED COURSE  Pertinent labs & imaging results that were available during my care of the patient were reviewed by me and considered in my medical decision making (see chart for details).  This is a 61 year old female who comes into the hospital today feeling unwell. The patient had some hypoxia with EMS and has some increased work of breathing. The patient also has some diaphoresis. I will give the patient a DuoNeb treatment to help with her breathing as well as 3 L of normal saline Her initial blood pressure was in the 80s     The patient did have some increased work of breathing. We will place her on BiPAP. The patient's blood pressure did decrease. At that time the patient was called a code sepsis. I did give the patient 3 L of normal saline. I'll also give the patient some cefepime and vancomycin. The patient had been admitted to [facility]  for a stroke in April. The cefepime should also cover the patient's urinary tract infection. The patient's blood pressure did go up and down and fluctuated but stayed below 90. I discussed the case with the admitting attending. He reports that he will admit the patient and have the ICU staff perform the central line. It appears that the patient has urinary tract infection as well as a pneumonia. She will be admitted.  ____________________________________________   FINAL CLINICAL IMPRESSION(S) / ED DIAGNOSES  Final diagnoses:  Healthcare-associated pneumonia  Acute cystitis with hematuria  Sepsis, due to unspecified organism [facility])  Other specified hypotension      NEW MEDICATIONS STARTED DURING THIS VISIT:  Current Discharge Medication List       Note:  This document was prepared using Dragon voice recognition software and may include unintentional dictation errors.    Rebecka Apley, MD 09/16/16 [phone]

## 2016-09-16 NOTE — ED Notes (Signed)
Date and time results received: 09/16/16 0530   Test: troponin Critical Value: 0.41  Name of Provider Notified: Dr Zenda Alpers Orders Received? Or Actions Taken?: Acknowledged

## 2016-09-16 NOTE — Progress Notes (Signed)
   09/16/16 1135  Clinical Encounter Type  Visited With Family  Visit Type Initial  Referral From Nurse   Chaplain arrived for Advance Directive Education per consult request. Patient was unavailable. Chaplain educated patients sons. Family will contact Chaplain's office when ready to complete forms.

## 2016-09-16 NOTE — H&P (Signed)
[facility] - [place] [facility]   PATIENT NAME: Cathy Fox    MR#:  [id number]  DATE OF BIRTH:  Sep 04, 1955  DATE OF ADMISSION:  09/16/2016  PRIMARY CARE PHYSICIAN: Theresa Duty, MD   REQUESTING/REFERRING PHYSICIAN:   CHIEF COMPLAINT:   Chief Complaint  Patient presents with  . Cough  . Weakness    HISTORY OF PRESENT ILLNESS: Cathy Fox  is a 61 y.o. female with a known history of Degenerative disc disease, arthritis, anemia, diabetes mellitus, DVT, pulmonary embolism, CVA, antiphospholipid antibody syndrome on full dose antecolic patient with subcutaneous Lovenox presented to the emergency room with difficulty breathing and cough. Patient had difficulty breathing and cough for the last couple of days according to the patient's sister was at bedside. She does not use any home oxygen. Patient was found to be hypoxic in the emergency room and was put on BiPAP. She had fever and congestion and cough. Patient was given broad-spectrum IV antibiotics in the emergency room and code sepsis was called. She received IV fluids based on sepsis protocol. CT head was pending. No history of any fall or in the family member. Patient also has some redness to the skin behind the back of the neck. Chest x-ray revealed bilateral pneumonia. Hospitalist service was consulted for further care of the patient. Patient is awake but not completely oriented to time place and person. Unable to give much history most of the history obtained from patient's sister and ER physician.  PAST MEDICAL HISTORY:   Past Medical History:  Diagnosis Date  . Anemia   . Anxiety   . Arthritis   . DDD (degenerative disc disease), cervical   . Depression   . Diabetes mellitus without complication (HCC)   . DVT (deep venous thrombosis) (HCC)   . GERD (gastroesophageal reflux disease)   . Headache   . History of pulmonary embolus (PE) 2005  . Hx of antiphospholipid antibody syndrome   .  Hypertension   . Hypotension   . Pancreatitis [address], [place]  . Shortness of breath dyspnea   . Sleep apnea   . Vertigo     PAST SURGICAL HISTORY: Past Surgical History:  Procedure Laterality Date  . ABDOMINAL HYSTERECTOMY    . DILATION AND CURETTAGE OF UTERUS    . JOINT REPLACEMENT Left    Total knee Replacement  . NASAL SEPTOPLASTY W/ TURBINOPLASTY Bilateral 12/16/2014   Procedure: NASAL SEPTOPLASTY WITH BILATERAL TURBINATE REDUCTION;  Surgeon: Linus Salmons, MD;  Location: [facility] ORS;  Service: ENT;  Laterality: Bilateral;    SOCIAL HISTORY:  Social History  Substance Use Topics  . Smoking status: Current Every Day Smoker    Packs/day: 1.00    Types: Cigarettes  . Smokeless tobacco: Never Used  . Alcohol use No    FAMILY HISTORY:  Family History  Problem Relation Age of Onset  . Heart disease Mother   . Heart disease Father   . Kidney cancer Father   . Colon cancer Father     DRUG ALLERGIES: No Known Allergies  REVIEW OF SYSTEMS:  Patient lethargic and does not volunteer much information  MEDICATIONS AT HOME:  Prior to Admission medications   Medication Sig Start Date End Date Taking? Authorizing Provider  busPIRone (BUSPAR) 10 MG tablet Take 20 mg by mouth 3 (three) times daily.    [provider]  diazepam (VALIUM) 5 MG tablet Take 7.5 mg by mouth 3 (three) times daily. for anxiety  04/03/15   [provider]  docusate sodium (STOOL SOFTENER) 100 MG capsule Take 100 mg by mouth 2 (two) times daily.    [provider]  enoxaparin (LOVENOX) 100 MG/ML injection Inject 100 mg into the skin every 12 (twelve) hours. As needed prescription if patient had to have surgery.    [provider]  famotidine (PEPCID) 20 MG tablet Take 20 mg by mouth 2 (two) times daily.    [provider]  FLUoxetine (PROZAC) 20 MG capsule Take 60 mg by mouth every morning.     [provider]  fluticasone (FLONASE) 50 MCG/ACT nasal  spray Place 1 spray into both nostrils daily. 01/08/16   Hower, Cletis [place], MD  folic acid (FOLVITE) 1 MG tablet Take 1 mg by mouth 2 (two) times daily.     [provider]  furosemide (LASIX) 20 MG tablet Take 20-40 mg by mouth See admin instructions. Take 2 tablets (40 mg) by mouth every morning and Take 20 mg by mouth in the evening.    [provider]  glipiZIDE (GLUCOTROL XL) 5 MG 24 hr tablet Take 5 mg by mouth daily with breakfast.    [provider]  Guaifenesin 1200 MG TB12 Take 1,200 mg by mouth 2 (two) times daily.    [provider]  HYDROmorphone (DILAUDID) 4 MG tablet Take 4 mg by mouth See admin instructions. Take 4 mg by mouth every 4 to 6 hours as needed for pain. *Not to exceed 5 a day*    [provider]  metFORMIN (GLUCOPHAGE) 1000 MG tablet Take 1,000 mg by mouth 2 (two) times daily with a meal.    [provider]  methotrexate 50 MG/2ML injection Inject 0.8 mLs into the skin once a week. Use on Wednesday. 08/13/15   [provider]  mirtazapine (REMERON) 15 MG tablet Take 1 tablet (15 mg total) by mouth at bedtime. 01/07/16   Hower, Cletis [place], MD  morphine (MS CONTIN) 30 MG 12 hr tablet Take 30 mg by mouth every 12 (twelve) hours. 01/01/16   [provider]  predniSONE (STERAPRED UNI-PAK 21 TAB) 10 MG (21) TBPK tablet 40mg  x1 day, 20 mg x2 day, 10 mg x2 day then stop 01/07/16   Hower, 03/08/16, MD  PROAIR HFA 108 [phone] Base) MCG/ACT inhaler INHALE 2 PUFFS PO Q 6 TO 8 H PRN FOR WHEEZING 03/19/15   [provider]  SUMAtriptan (IMITREX) 50 MG tablet Take 50 mg by mouth every 2 (two) hours as needed for migraine. May repeat in 2 hours if headache persists or recurs.    [provider]  tiotropium (SPIRIVA) 18 MCG inhalation capsule Place 1 capsule (18 mcg total) into inhaler and inhale daily. 01/07/16   Hower, 03/08/16, MD  tizanidine (ZANAFLEX) 6 MG capsule Take 6 mg by mouth 4 (four) times daily -  with  meals and at bedtime.    [provider]  varenicline (CHANTIX PAK) 0.5 MG X 11 & 1 MG X 42 tablet Take one 0.5 mg tablet by mouth once daily for 3 days, then increase to one 0.5 mg tablet twice daily for 4 days, then increase to one 1 mg tablet twice daily. 05/04/15   07/02/15, MD  warfarin (COUMADIN) 10 MG tablet Take 10-15 mg by mouth See admin instructions. Take 10 mg tablet by mouth every evening on Monday, Tuesday, Wednesday, Friday, Saturday. Take 15 mg tablet by mouth in the evening on Thursday and  Sunday.    [provider]      PHYSICAL EXAMINATION:   VITAL SIGNS: Blood pressure (!) 80/58, pulse 72, temperature 98.4 F (36.9 C), temperature source Oral, resp. rate (!) 8, height 5\' 7"  (1.702 m), weight 90.7 kg (200 lb), SpO2 98 %.  GENERAL:  61 y.o.-year-old patient lying in the bed in respiratory distress on bipap EYES: Pupils equal, round, reactive to light and accommodation. No scleral icterus. Extraocular muscles intact.  HEENT: Head atraumatic, normocephalic. Oropharynx and nasopharynx clear.  NECK:  Supple, no jugular venous distention. No thyroid enlargement, no tenderness.  LUNGS: Decreased breath sounds bilaterally, rales heard in both lungs.  Scattered wheezes noted. CARDIOVASCULAR: S1, S2 normal. No murmurs, rubs, or gallops.  ABDOMEN: Soft, nontender, nondistended. Bowel sounds present. No organomegaly or mass.  EXTREMITIES: No pedal edema, cyanosis, or clubbing.  NEUROLOGIC: Awake and arousable Moves all extremities, cranial nerves appear intact PSYCHIATRIC: could not be assessed SKIN: redness of skin behind the neck  LABORATORY PANEL:   CBC  Recent Labs Lab 09/16/16 0445  WBC 14.4*  HGB 10.7*  HCT 33.1*  PLT 180  MCV 74.4*  MCH 24.0*  MCHC 32.3  RDW 24.8*  LYMPHSABS 1.7  MONOABS 0.1*  EOSABS 0.0  BASOSABS 0.1    ------------------------------------------------------------------------------------------------------------------  Chemistries   Recent Labs Lab 09/16/16 0445  NA 133*  K 2.9*  CL 101  CO2 24  GLUCOSE 108*  BUN 23*  CREATININE 0.98  CALCIUM 8.4*  AST 72*  ALT 25  ALKPHOS 97  BILITOT 0.6   ------------------------------------------------------------------------------------------------------------------ estimated creatinine clearance is 69.7 mL/min (by C-G formula based on SCr of 0.98 mg/dL). ------------------------------------------------------------------------------------------------------------------ No results for input(s): TSH, T4TOTAL, T3FREE, THYROIDAB in the last 72 hours.  Invalid input(s): FREET3   Coagulation profile  Recent Labs Lab 09/16/16 0445  INR 2.12   ------------------------------------------------------------------------------------------------------------------- No results for input(s): DDIMER in the last 72 hours. -------------------------------------------------------------------------------------------------------------------  Cardiac Enzymes  Recent Labs Lab 09/16/16 0445  TROPONINI 0.41*   ------------------------------------------------------------------------------------------------------------------ Invalid input(s): POCBNP  ---------------------------------------------------------------------------------------------------------------  Urinalysis    Component Value Date/Time   COLORURINE YELLOW (A) 09/16/2016 0445   APPEARANCEUR CLOUDY (A) 09/16/2016 0445   LABSPEC 1.014 09/16/2016 0445   PHURINE 5.0 09/16/2016 0445   GLUCOSEU NEGATIVE 09/16/2016 0445   HGBUR SMALL (A) 09/16/2016 0445   BILIRUBINUR NEGATIVE 09/16/2016 0445   KETONESUR NEGATIVE 09/16/2016 0445   PROTEINUR NEGATIVE 09/16/2016 0445   NITRITE NEGATIVE 09/16/2016 0445   LEUKOCYTESUR LARGE (A) 09/16/2016 0445     RADIOLOGY: Dg Chest Port 1  View  Result Date: 09/16/2016 CLINICAL DATA:  Cough and hypoxia. EXAM: PORTABLE CHEST 1 VIEW COMPARISON:  01/05/2016 FINDINGS: The lungs are hyperinflated. Central bronchitic changes are stable. Ill-defined heterogeneous bibasilar opacities are unchanged from prior exam. Heart size is unchanged at the upper limits of normal. Unchanged mediastinal contours. No pleural fluid. No pneumothorax. No acute osseous abnormality. IMPRESSION: Chronic hyperinflation and bronchial thickening. Heterogeneous bibasilar opacities are unchanged from prior exam, may be atelectasis or scarring. Electronically Signed   By: Rubye Oaks M.D.   On: 09/16/2016 05:21    EKG: Orders placed or performed during the hospital encounter of 09/16/16  . ED EKG 12-Lead  . ED EKG 12-Lead  . EKG 12-Lead  . EKG 12-Lead    IMPRESSION AND PLAN: 61 year old female patient with history of DVT, pulmonary embolism, antiphospholipid antibody syndrome, diabetes meters, hypertension presented to the emergency room with shortness of breath, fever and decreased responsiveness. Admitting diagnosis 1. Acute  respiratory failure 2. Bilateral pneumonia 3. Sepsis 4. Hypokalemia 5. Altered mental status 6. Abnormal troponin possibly secondary to demand ischemia Treatment plan Admit patient to ICU BiPAP for respiratory failure Start patient on IV vancomycin and IV cefepime antibiotic Intensivist consultation for further management Follow-up cultures Replace potassium Continue antecolic patient with full dose Lovenox subcutaneously every 12 hourly, patient has antiphospholipid antibody syndrome Follow up CT head CODE STATUS full code  All the records are reviewed and case discussed with ED provider. Management plans discussed with the patient, family and they are in agreement.  CODE STATUS:FULL CODE Code Status History    Date Active Date Inactive Code Status Order ID Comments User Context   01/05/2016  7:58 PM 01/07/2016  2:51  PM Full Code [id number]  Auburn Bilberry, MD Inpatient       TOTAL CRITICLA CARE TIME TAKING CARE OF THIS PATIENT: 57 minutes.    Ihor Austin M.D on 09/16/2016 at 6:10 AM  Between 7am to 6pm - Pager - [phone]  After 6pm go to [email or web address] - password [email or web address] [facility]  [facility]  Office  [phone]  CC: Primary care physician; Theresa Duty, MD

## 2016-09-16 NOTE — ED Notes (Signed)
Patient transported to CT 

## 2016-09-16 NOTE — Final Consult Note (Addendum)
[facility] Consultation    ASSESSMENT/PLAN  Multi lobar pneumonia. Patient presently on cefepime and vancomycin. We'll send respiratory culture data when able.  Respiratory failure presently on noninvasive ventilation. Breathing comfortably at this time. Prolonged expiratory phase, rales and rhonchi. We'll continue antibiotics, albuterol, Atrovent, add Solu-Medrol  Septic shock. Patient is on her fourth liter of normal saline. If blood pressure does not improve will place central line and start on norepinephrine.  Antiphospholipid antibody syndrome/thromboembolic disease. Systemic anticoagulation   Leukocytosis. Blood urine and sputum cultures  Anemia no evidence of active bleeding  Hypokalemia we'll replace a lecture light replacement protocol  Prerenal azotemia. Most likely reflects decreased effective renal plasma flow from hypotension and volume contraction. Presently being volume resuscitated  Elevated troponin at 0.41. Mildly elevated troponin most likely reflects supply demand ischemia. We'll continue to cycle cardiac enzymes and follow-up EKG tracing  Urinary tract infection. Noted to have many bacteria and large leukocytes in urine. Will be adequately covered with vancomycin and cefepime. We'll continue culture to see if any organism grows.     Name: Cathy Fox MRN: [id number] DOB: 15-Oct-1955    ADMISSION DATE:  09/16/2016 CONSULTATION DATE: 09/16/2016  REFERRING MD : Dr. Juliene Pina  CHIEF COMPLAINT:  Shortness of breath, hypotension   HISTORY OF PRESENT ILLNESS:  Cathy Fox is a 61 year old female with a past medical history remarkable for antiphospholipid antibody syndrome, lumbar embolic disease, diabetes, gastroesophageal reflux disease, anxiety/depression, degenerative disc disease, anemia, hypertension, obstructive sleep apnea, presents with several days of not feeling well. She has had some cough congestion and green sputum production. Was  brought in today by her sister who noticed a lump on the back of her neck. She was noted to have progressive dyspnea, in the emergency room she was noted to be severely hypoxemic with saturations in the 70s and 80%. Also noted to be hypotensive presently on her fourth liter of normal saline resuscitation. She is presently on noninvasive ventilation, awake alert and communicating.  PAST MEDICAL HISTORY :  Past Medical History:  Diagnosis Date  . Anemia   . Anxiety   . Arthritis   . DDD (degenerative disc disease), cervical   . Depression   . Diabetes mellitus without complication (HCC)   . DVT (deep venous thrombosis) (HCC)   . GERD (gastroesophageal reflux disease)   . Headache   . History of pulmonary embolus (PE) 2005  . Hx of antiphospholipid antibody syndrome   . Hypertension   . Hypotension   . Pancreatitis [address], [place]  . Shortness of breath dyspnea   . Sleep apnea   . Vertigo    Past Surgical History:  Procedure Laterality Date  . ABDOMINAL HYSTERECTOMY    . DILATION AND CURETTAGE OF UTERUS    . JOINT REPLACEMENT Left    Total knee Replacement  . NASAL SEPTOPLASTY W/ TURBINOPLASTY Bilateral 12/16/2014   Procedure: NASAL SEPTOPLASTY WITH BILATERAL TURBINATE REDUCTION;  Surgeon: Linus Salmons, MD;  Location: [facility] ORS;  Service: ENT;  Laterality: Bilateral;   Prior to Admission medications   Medication Sig Start Date End Date Taking? Authorizing Provider  atorvastatin (LIPITOR) 80 MG tablet Take 80 mg by mouth daily.   Yes [provider]  busPIRone (BUSPAR) 10 MG tablet Take 20 mg by mouth 3 (three) times daily.   Yes [provider]  diazepam (VALIUM) 5 MG tablet Take 7.5 mg by mouth 3 (three) times daily. for anxiety 04/03/15  Yes [provider]  docusate sodium (STOOL SOFTENER) 100 MG capsule Take 100 mg by mouth 2 (two) times daily.   Yes [provider]  esomeprazole (NEXIUM) 40 MG capsule Take 40 mg by mouth daily at 12  noon.   Yes [provider]  ferrous sulfate 325 (65 FE) MG tablet Take 325 mg by mouth daily with breakfast.   Yes [provider]  fluticasone (FLONASE) 50 MCG/ACT nasal spray Place 1 spray into both nostrils daily. 01/08/16  Yes Hower, Cletis [place], MD  folic acid (FOLVITE) 1 MG tablet Take 1 mg by mouth 2 (two) times daily.    Yes [provider]  furosemide (LASIX) 20 MG tablet Take 20-40 mg by mouth See admin instructions. Take 2 tablets (40 mg) by mouth every morning and Take 20 mg by mouth in the evening.   Yes [provider]  metFORMIN (GLUCOPHAGE) 1000 MG tablet Take 1,000 mg by mouth 2 (two) times daily with a meal.   Yes [provider]  methotrexate 50 MG/2ML injection Inject 0.8 mLs into the skin once a week. Use on Wednesday. 08/13/15  Yes [provider]  metoprolol tartrate (LOPRESSOR) 25 MG tablet Take 25 mg by mouth 2 (two) times daily.   Yes [provider]  morphine (MS CONTIN) 15 MG 12 hr tablet Take 15 mg by mouth every 12 (twelve) hours.   Yes [provider]  nicotine (NICODERM CQ - DOSED IN MG/24 HOURS) 21 mg/24hr patch Place 21 mg onto the skin daily.   Yes [provider]  omeprazole (PRILOSEC) 40 MG capsule Take 40 mg by mouth daily.   Yes [provider]  pregabalin (LYRICA) 150 MG capsule Take 150 mg by mouth 3 (three) times daily.   Yes [provider]  PROAIR HFA 108 (90 Base) MCG/ACT inhaler INHALE 2 PUFFS PO Q 6 TO 8 H PRN FOR WHEEZING 03/19/15  Yes [provider]  promethazine (PHENERGAN) 25 MG tablet Take 25 mg by mouth every 6 (six) hours as needed for nausea or vomiting.   Yes [provider]  tiotropium (SPIRIVA) 18 MCG inhalation capsule Place 1 capsule (18 mcg total) into inhaler and inhale daily. 01/07/16  Yes Hower, Cletis [place], MD  tiZANidine (ZANAFLEX) 4 MG tablet Take 4 mg by mouth 4 (four) times daily -  with meals and at bedtime.    Yes [provider]  warfarin (COUMADIN) 10 MG tablet Take 5-10 mg by mouth See admin instructions. Take5 mg tablet by mouth every evening on Monday, Wednesday, Saturday. Take 10 mg tablet by mouth on Tuesday, Thursday, Friday and Sunday   Yes [provider]   No Known Allergies  FAMILY HISTORY:  Family History  Problem Relation Age of Onset  . Heart disease Mother   . Heart disease Father   . Kidney cancer Father   . Colon cancer Father    SOCIAL HISTORY:  reports that she has been smoking Cigarettes.  She has been smoking about 1.00 pack per day. She has never used smokeless tobacco. She reports that she does not drink alcohol or use drugs.  REVIEW OF SYSTEMS:      The review of systems were reviewed and were found to be negative other than what is documented in the HPI.    VITAL SIGNS: Temp:  [98.4 F (36.9 C)] 98.4 F (36.9 C) (06/22 0434) Pulse Rate:  [68-74] 68 (06/22 0645) Resp:  [6-28] 10 (06/22 0645) BP: (67-112)/(34-95)  87/52 (06/22 0645) SpO2:  [96 %-100 %] 96 % (06/22 0645) Weight:  [90.7 kg (200 lb)] 90.7 kg (200 lb) (06/22 0439) HEMODYNAMICS:   VENTILATOR SETTINGS:   INTAKE / OUTPUT:  Intake/Output Summary (Last 24 hours) at 09/16/16 0734 Last data filed at 09/16/16 0549  Gross per 24 hour  Intake             2000 ml  Output              100 ml  Net             1900 ml    Physical Examination:   VS: BP (!) 87/52   Pulse 68   Temp 98.4 F (36.9 C) (Oral)   Resp 10   Ht 5\' 7"  (1.702 m)   Wt 90.7 kg (200 lb)   SpO2 96%   BMI 31.32 kg/m   General Appearance: No distress  Neuro:without focal findings, mental status, speech normal,. HEENT: PERRLA, EOM intact, no ptosis, no other lesions noticed;  Pulmonary: Diffuse expiratory rhonchi noted, prolonged expiratory phase, Rales and rhonchi appreciated right greater than left CardiovascularNormal S1,S2.  No m/r/g.    Abdomen: Benign, Soft, non-tender, No masses, hepatosplenomegaly, No  lymphadenopathy Renal:  No costovertebral tenderness  GU:  Not performed at this time. Endoc: No evident thyromegaly, no signs of acromegaly. Skin:   warm, no rashes, no ecchymosis  Extremities: normal, no cyanosis, clubbing, no edema, warm with normal capillary refill.    LABS: Reviewed   LABORATORY PANEL:   CBC  Recent Labs Lab 09/16/16 0445  WBC 14.4*  HGB 10.7*  HCT 33.1*  PLT 180    Chemistries   Recent Labs Lab 09/16/16 0445  NA 133*  K 2.9*  CL 101  CO2 24  GLUCOSE 108*  BUN 23*  CREATININE 0.98  CALCIUM 8.4*  AST 72*  ALT 25  ALKPHOS 97  BILITOT 0.6     Recent Labs Lab 09/16/16 0702  GLUCAP 126*   No results for input(s): PHART, PCO2ART, PO2ART in the last 168 hours.  Recent Labs Lab 09/16/16 0445  AST 72*  ALT 25  ALKPHOS 97  BILITOT 0.6  ALBUMIN 3.1*    Cardiac Enzymes  Recent Labs Lab 09/16/16 0445  TROPONINI 0.41*    RADIOLOGY:  Ct Head Wo Contrast  Result Date: 09/16/2016 CLINICAL DATA:  Fall.  Neck swelling, on anticoagulation.  Fever. EXAM: CT HEAD WITHOUT CONTRAST TECHNIQUE: Contiguous axial images were obtained from the base of the skull through the vertex without intravenous contrast. COMPARISON:  None. FINDINGS: Brain: No evidence of acute infarction, hemorrhage, hydrocephalus, extra-axial collection or mass lesion/mass effect. Vascular: Atherosclerosis of skullbase vasculature without hyperdense vessel or abnormal calcification. Skull: No skull fracture or focal lesion. Sinuses/Orbits: Mild mucosal thickening of the ethmoid air cells. No fluid levels. Mastoid air cells are clear. Other: None. IMPRESSION: No acute intracranial abnormality. Electronically Signed   By: Rubye Oaks M.D.   On: 09/16/2016 06:17   Ct Soft Tissue Neck W Contrast  Result Date: 09/16/2016 CLINICAL DATA:  Initial evaluation for acute trauma, fall on Coumadin, neck swelling. EXAM: CT NECK WITH CONTRAST TECHNIQUE: Multidetector CT imaging of the  neck was performed using the standard protocol following the bolus administration of intravenous contrast. CONTRAST:  76mL ISOVUE-300 IOPAMIDOL (ISOVUE-300) INJECTION 61% COMPARISON:  None available. FINDINGS: Pharynx and larynx: Gaseous distension of the oral cavity, likely related to the presence of a non rebreather. Oral cavity otherwise within normal  limits without mass lesion or loculated fluid collection. Patient is edentulous. Palatine tonsils symmetric and normal. Parapharyngeal fat preserved. Nasopharynx normal. Retropharyngeal soft tissues within normal limits. Epiglottis normal. Vallecula largely clear. Remainder of the hypopharynx and supraglottic larynx within normal limits. True cords symmetric and within normal limits. Possible layering secretions within the subglottic trachea near the carina. Subglottic airway otherwise clear. Salivary glands: Salivary glands including the parotid and submandibular glands are normal. Thyroid: Subcentimeter hypodense nodule noted within the right thyroid lobe, of doubtful significance. Thyroid otherwise unremarkable. Lymph nodes: No pathologically enlarged lymph nodes identified within the neck. Scattered subcentimeter mediastinal lymph nodes noted. Vascular: Normal intravascular enhancement seen throughout the neck. Atheromatous plaque about the right carotid bifurcation. Mild medial is a shin of the carotid artery's into the retropharyngeal space noted. Limited intracranial: Unremarkable. Visualized orbits: Visualized globes and orbital soft tissues within normal limits. Mastoids and visualized paranasal sinuses: Scattered mucosal thickening within the right maxillary sinus and visualized ethmoidal air cells. Visualized paranasal sinuses otherwise clear. Visualize mastoids and middle ear cavities are clear. Skeleton: No acute osseous abnormality. No worrisome lytic or blastic osseous lesions. Moderate degenerative spondylolysis present at C5-6 and C6-7. Upper chest:  Multifocal nodular and patchy opacities within the visualized upper lobes, left greater than right patchy involvement within the superior segment left lower lobe. Findings concerning for possible multifocal infection. Other: No other acute abnormality within the neck. No discrete soft tissue hematoma or evidence for traumatic injury. IMPRESSION: 1. No acute abnormality identified within the neck. No evidence for traumatic injury status post recent fall. 2. Multifocal nodular and ground-glass opacities within the visualized lungs, left greater than right, concerning for multifocal pneumonia/infection. 3. Layering secretions within the subglottic trachea. Finding suggests that this patient may be at risk for aspiration. Electronically Signed   By: Rise Mu M.D.   On: 09/16/2016 06:55   Dg Chest Port 1 View  Result Date: 09/16/2016 CLINICAL DATA:  Cough and hypoxia. EXAM: PORTABLE CHEST 1 VIEW COMPARISON:  01/05/2016 FINDINGS: The lungs are hyperinflated. Central bronchitic changes are stable. Ill-defined heterogeneous bibasilar opacities are unchanged from prior exam. Heart size is unchanged at the upper limits of normal. Unchanged mediastinal contours. No pleural fluid. No pneumothorax. No acute osseous abnormality. IMPRESSION: Chronic hyperinflation and bronchial thickening. Heterogeneous bibasilar opacities are unchanged from prior exam, may be atelectasis or scarring. Electronically Signed   By: Rubye Oaks M.D.   On: 09/16/2016 05:21       Tora Kindred, DO ICU Pager [phone] [facility] Office Number: [phone]  09/16/2016, 7:34 AM

## 2016-09-17 LAB — COMPREHENSIVE METABOLIC PANEL
ALK PHOS: 74 U/L (ref 38–126)
ALT: 22 U/L (ref 14–54)
ALT: 22 U/L (ref 14–54)
AST: 45 U/L — ABNORMAL HIGH (ref 15–41)
AST: 45 U/L — AB (ref 15–41)
Albumin: 2.3 g/dL — ABNORMAL LOW (ref 3.5–5.0)
Albumin: 2.4 g/dL — ABNORMAL LOW (ref 3.5–5.0)
Alkaline Phosphatase: 77 U/L (ref 38–126)
Anion gap: 2 — ABNORMAL LOW (ref 5–15)
Anion gap: 4 — ABNORMAL LOW (ref 5–15)
BILIRUBIN TOTAL: 0.4 mg/dL (ref 0.3–1.2)
BILIRUBIN TOTAL: 0.5 mg/dL (ref 0.3–1.2)
BUN: 9 mg/dL (ref 6–20)
BUN: 11 mg/dL (ref 6–20)
CO2: 24 mmol/L (ref 22–32)
CO2: 23 mmol/L (ref 22–32)
CREATININE: 0.45 mg/dL (ref 0.44–1.00)
Calcium: 8 mg/dL — ABNORMAL LOW (ref 8.9–10.3)
Calcium: 8.3 mg/dL — ABNORMAL LOW (ref 8.9–10.3)
Chloride: 113 mmol/L — ABNORMAL HIGH (ref 101–111)
Chloride: 111 mmol/L (ref 101–111)
Creatinine, Ser: 0.46 mg/dL (ref 0.44–1.00)
GFR calc non Af Amer: >60 mL/min (ref >60)
Glucose, Bld: 171 mg/dL — ABNORMAL HIGH (ref 65–99)
Glucose, Bld: 207 mg/dL — ABNORMAL HIGH (ref 65–99)
POTASSIUM: 3.5 mmol/L (ref 3.5–5.1)
Potassium: 3.2 mmol/L — ABNORMAL LOW (ref 3.5–5.1)
Sodium: 139 mmol/L (ref 135–145)
Sodium: 138 mmol/L (ref 135–145)
TOTAL PROTEIN: 5.5 g/dL — AB (ref 6.5–8.1)
TOTAL PROTEIN: 5.7 g/dL — AB (ref 6.5–8.1)

## 2016-09-17 LAB — CBC
HCT: 26.7 % — ABNORMAL LOW (ref 35.0–47.0)
HEMOGLOBIN: 8.6 g/dL — AB (ref 12.0–16.0)
MCH: 24.1 pg — AB (ref 26.0–34.0)
MCHC: 32.1 g/dL (ref 32.0–36.0)
MCV: 75.2 fL — ABNORMAL LOW (ref 80.0–100.0)
PLATELETS: 143 10*3/uL — AB (ref 150–440)
RBC: 3.55 MIL/uL — AB (ref 3.80–5.20)
RDW: 25 % — ABNORMAL HIGH (ref 11.5–14.5)
WBC: 6.4 10*3/uL (ref 3.6–11.0)

## 2016-09-17 LAB — GLUCOSE, CAPILLARY
GLUCOSE-CAPILLARY: 206 mg/dL — AB (ref 65–99)
GLUCOSE-CAPILLARY: 250 mg/dL — AB (ref 65–99)
Glucose-Capillary: 173 mg/dL — ABNORMAL HIGH (ref 65–99)
Glucose-Capillary: 269 mg/dL — ABNORMAL HIGH (ref 65–99)

## 2016-09-17 LAB — VANCOMYCIN, TROUGH: Vancomycin Tr: 10 ug/mL — ABNORMAL LOW (ref 15–20)

## 2016-09-17 LAB — PROTIME-INR
INR: 2.56
PROTHROMBIN TIME: 28 s — AB (ref 11.4–15.2)

## 2016-09-17 MED ORDER — ORPHENADRINE CITRATE ER 100 MG PO TB12
100.0000 mg | ORAL_TABLET | Freq: Two times a day (BID) | ORAL | Status: DC
  Administered 2016-09-17 – 2016-09-19 (×5): 100 mg via ORAL
  Filled 2016-09-17 (×6): qty 1

## 2016-09-17 MED ORDER — DIAZEPAM 5 MG PO TABS
7.5000 mg | ORAL_TABLET | Freq: Every day | ORAL | Status: DC
  Administered 2016-09-17: 7.5 mg via ORAL
  Filled 2016-09-17: qty 2

## 2016-09-17 MED ORDER — POTASSIUM CHLORIDE 20 MEQ PO PACK
20.0000 meq | PACK | Freq: Once | ORAL | Status: AC
  Administered 2016-09-17: 20 meq via ORAL
  Filled 2016-09-17: qty 1

## 2016-09-17 NOTE — Progress Notes (Signed)
Left IJ triple lumen removed per policy and procedure.  Blue tip intact.  Patient tolerated well.  

## 2016-09-17 NOTE — Progress Notes (Signed)
Verbal report called to [facility] on 1A.  Patient transported via bed to room 135 with RN present.

## 2016-09-17 NOTE — Progress Notes (Signed)
[facility] - [place] at [facility]   PATIENT NAME: Cathy Fox    MR#:  [id number]  DATE OF BIRTH:  03-Jan-1956  SUBJECTIVE:  CHIEF COMPLAINT:   Chief Complaint  Patient presents with  . Cough  . Weakness  Patient is a 61 year old Caucasian female with history of antiphospholipid antibody syndrome, diabetes, gastroesophageal reflux disease, anxiety, depression, obstructive sleep apnea, who presents to the hospital with complaints of cough, chest congestion, green sputum production, shortness of breath. In emergency room, she was hypoxic with O2 sats in 70s to 80s, she was also noted to be hypotensive requiring 4 L of normal saline resuscitation. She was admitted to intensive care unit for noninvasive ventilation. Chest x-ray was unremarkable, however, patient's soft tissue CT revealed multifocal pneumonia. Patient is on vancomycin and cefepime, denies any sputum production at present, admits of rattling in the chest, feels better. Blood pressure has improved as well as oxygenation, now off BiPAP, on 3 L of oxygen nasal cannula with O2 sats ranging from 94-97%  Review of Systems  Constitutional: Negative for chills, fever and weight loss.  HENT: Negative for congestion.   Eyes: Negative for blurred vision and double vision.  Respiratory: Positive for cough, sputum production, shortness of breath and wheezing.   Cardiovascular: Negative for chest pain, palpitations, orthopnea, leg swelling and PND.  Gastrointestinal: Negative for abdominal pain, blood in stool, constipation, diarrhea, nausea and vomiting.  Genitourinary: Negative for dysuria, frequency, hematuria and urgency.  Musculoskeletal: Negative for falls.  Neurological: Negative for dizziness, tremors, focal weakness and headaches.  Endo/Heme/Allergies: Does not bruise/bleed easily.  Psychiatric/Behavioral: Negative for depression. The patient does not have insomnia.     VITAL SIGNS: Blood pressure  119/63, pulse 72, temperature 97.9 F (36.6 C), temperature source Oral, resp. rate 18, height 5\' 5"  (1.651 m), weight 93.9 kg (207 lb 0.2 oz), SpO2 94 %.  PHYSICAL EXAMINATION:   GENERAL:  61 y.o.-year-old patient lying in the bed in mild to moderate respiratory distress, pursed lip breathing.  EYES: Pupils equal, round, reactive to light and accommodation. No scleral icterus. Extraocular muscles intact.  HEENT: Head atraumatic, normocephalic. Oropharynx and nasopharynx clear.  NECK:  Supple, no jugular venous distention. No thyroid enlargement, no tenderness.  LUNGS: Diminished breath sounds bilaterally, scattered wheezing, rales,rhonchi , but no crepitations. Intermittent use of accessory muscles of respiration.  CARDIOVASCULAR: S1, S2 normal. No murmurs, rubs, or gallops.  ABDOMEN: Soft, nontender, nondistended. Bowel sounds present. No organomegaly or mass.  EXTREMITIES: No pedal edema, cyanosis, or clubbing.  NEUROLOGIC: Cranial nerves II through XII are intact. Muscle strength 5/5 in all extremities. Sensation intact. Gait not checked.  PSYCHIATRIC: The patient is alert and oriented x 3.  SKIN: No obvious rash, lesion, or ulcer.   ORDERS/RESULTS REVIEWED:   CBC  Recent Labs Lab 09/16/16 0445 09/17/16 0523  WBC 14.4* 6.4  HGB 10.7* 8.6*  HCT 33.1* 26.7*  PLT 180 143*  MCV 74.4* 75.2*  MCH 24.0* 24.1*  MCHC 32.3 32.1  RDW 24.8* 25.0*  LYMPHSABS 1.7  --   MONOABS 0.1*  --   EOSABS 0.0  --   BASOSABS 0.1  --    ------------------------------------------------------------------------------------------------------------------  Chemistries   Recent Labs Lab 09/16/16 0445 09/16/16 0746 09/16/16 1700 09/17/16 0523  NA 133*  --   --  139  K 2.9*  --  3.4* 3.2*  CL 101  --   --  113*  CO2 24  --   --  24  GLUCOSE 108*  --   --  171*  BUN 23*  --   --  9  CREATININE 0.98  --   --  0.45  CALCIUM 8.4*  --   --  8.0*  MG  --  1.3* 2.1  --   AST 72*  --   --  45*  ALT  25  --   --  22  ALKPHOS 97  --   --  74  BILITOT 0.6  --   --  0.4   ------------------------------------------------------------------------------------------------------------------ estimated creatinine clearance is 83.7 mL/min (by C-G formula based on SCr of 0.45 mg/dL). ------------------------------------------------------------------------------------------------------------------ No results for input(s): TSH, T4TOTAL, T3FREE, THYROIDAB in the last 72 hours.  Invalid input(s): FREET3  Cardiac Enzymes  Recent Labs Lab 09/16/16 0445 09/16/16 0746 09/16/16 1607  TROPONINI 0.41* 0.29* 0.15*   ------------------------------------------------------------------------------------------------------------------ Invalid input(s): POCBNP ---------------------------------------------------------------------------------------------------------------  RADIOLOGY: Ct Head Wo Contrast  Result Date: 09/16/2016 CLINICAL DATA:  Fall.  Neck swelling, on anticoagulation.  Fever. EXAM: CT HEAD WITHOUT CONTRAST TECHNIQUE: Contiguous axial images were obtained from the base of the skull through the vertex without intravenous contrast. COMPARISON:  None. FINDINGS: Brain: No evidence of acute infarction, hemorrhage, hydrocephalus, extra-axial collection or mass lesion/mass effect. Vascular: Atherosclerosis of skullbase vasculature without hyperdense vessel or abnormal calcification. Skull: No skull fracture or focal lesion. Sinuses/Orbits: Mild mucosal thickening of the ethmoid air cells. No fluid levels. Mastoid air cells are clear. Other: None. IMPRESSION: No acute intracranial abnormality. Electronically Signed   By: Rubye Oaks M.D.   On: 09/16/2016 06:17   Ct Soft Tissue Neck W Contrast  Result Date: 09/16/2016 CLINICAL DATA:  Initial evaluation for acute trauma, fall on Coumadin, neck swelling. EXAM: CT NECK WITH CONTRAST TECHNIQUE: Multidetector CT imaging of the neck was performed using the  standard protocol following the bolus administration of intravenous contrast. CONTRAST:  61mL ISOVUE-300 IOPAMIDOL (ISOVUE-300) INJECTION 61% COMPARISON:  None available. FINDINGS: Pharynx and larynx: Gaseous distension of the oral cavity, likely related to the presence of a non rebreather. Oral cavity otherwise within normal limits without mass lesion or loculated fluid collection. Patient is edentulous. Palatine tonsils symmetric and normal. Parapharyngeal fat preserved. Nasopharynx normal. Retropharyngeal soft tissues within normal limits. Epiglottis normal. Vallecula largely clear. Remainder of the hypopharynx and supraglottic larynx within normal limits. True cords symmetric and within normal limits. Possible layering secretions within the subglottic trachea near the carina. Subglottic airway otherwise clear. Salivary glands: Salivary glands including the parotid and submandibular glands are normal. Thyroid: Subcentimeter hypodense nodule noted within the right thyroid lobe, of doubtful significance. Thyroid otherwise unremarkable. Lymph nodes: No pathologically enlarged lymph nodes identified within the neck. Scattered subcentimeter mediastinal lymph nodes noted. Vascular: Normal intravascular enhancement seen throughout the neck. Atheromatous plaque about the right carotid bifurcation. Mild medial is a shin of the carotid artery's into the retropharyngeal space noted. Limited intracranial: Unremarkable. Visualized orbits: Visualized globes and orbital soft tissues within normal limits. Mastoids and visualized paranasal sinuses: Scattered mucosal thickening within the right maxillary sinus and visualized ethmoidal air cells. Visualized paranasal sinuses otherwise clear. Visualize mastoids and middle ear cavities are clear. Skeleton: No acute osseous abnormality. No worrisome lytic or blastic osseous lesions. Moderate degenerative spondylolysis present at C5-6 and C6-7. Upper chest: Multifocal nodular and  patchy opacities within the visualized upper lobes, left greater than right patchy involvement within the superior segment left lower lobe. Findings concerning for possible multifocal infection. Other: No other acute abnormality within the neck.  No discrete soft tissue hematoma or evidence for traumatic injury. IMPRESSION: 1. No acute abnormality identified within the neck. No evidence for traumatic injury status post recent fall. 2. Multifocal nodular and ground-glass opacities within the visualized lungs, left greater than right, concerning for multifocal pneumonia/infection. 3. Layering secretions within the subglottic trachea. Finding suggests that this patient may be at risk for aspiration. Electronically Signed   By: Rise Mu M.D.   On: 09/16/2016 06:55   Dg Chest Port 1 View  Result Date: 09/16/2016 CLINICAL DATA:  Status post central line placement EXAM: PORTABLE CHEST 1 VIEW COMPARISON:  09/16/2016 FINDINGS: Cardiac shadow is stable. New left jugular central line is seen with the catheter tip at the cavoatrial junction. Bibasilar changes are again noted and stable. No pneumothorax is seen. No bony abnormality is noted. IMPRESSION: No pneumothorax following central line placement. Electronically Signed   By: Alcide Clever M.D.   On: 09/16/2016 11:59   Dg Chest Port 1 View  Result Date: 09/16/2016 CLINICAL DATA:  Cough and hypoxia. EXAM: PORTABLE CHEST 1 VIEW COMPARISON:  01/05/2016 FINDINGS: The lungs are hyperinflated. Central bronchitic changes are stable. Ill-defined heterogeneous bibasilar opacities are unchanged from prior exam. Heart size is unchanged at the upper limits of normal. Unchanged mediastinal contours. No pleural fluid. No pneumothorax. No acute osseous abnormality. IMPRESSION: Chronic hyperinflation and bronchial thickening. Heterogeneous bibasilar opacities are unchanged from prior exam, may be atelectasis or scarring. Electronically Signed   By: Rubye Oaks M.D.    On: 09/16/2016 05:21    EKG:  Orders placed or performed during the hospital encounter of 09/16/16  . ED EKG 12-Lead  . ED EKG 12-Lead  . EKG 12-Lead  . EKG 12-Lead    ASSESSMENT AND PLAN:  Active Problems:   Acute respiratory failure (HCC)  #1. Acute respiratory failure with hypoxia due to COPD exacerbation and pneumonia, continue oxygen therapy, wean off oxygen as tolerated #2. Multifocal pneumonia, get sputum cultures, blood cultures are negative so far, continue antibiotic therapy, discontinue vancomycin, since patient's MRSA PCR was negative, discussed with Dr. Lonn Georgia #3. Hyponatremia, resolved #4. Hypokalemia, improved #5. Elevated troponin, likely demand ischemia, improving, get echocardiogram #6. Leukocytosis, resolved #7. Pyuria, question of a urinary tract infection, cultures are pending, continue cefepime   Management plans discussed with the patient, family and they are in agreement.   DRUG ALLERGIES: No Known Allergies  CODE STATUS:     Code Status Orders        Start     Ordered   09/16/16 0648  Full code  Continuous     09/16/16 0647    Code Status History    Date Active Date Inactive Code Status Order ID Comments User Context   01/05/2016  7:58 PM 01/07/2016  2:51 PM Full Code [id number]  Auburn Bilberry, MD Inpatient      TOTAL TIME TAKING CARE OF THIS PATIENT: 40 minutes.    Katharina Caper M.D on 09/17/2016 at 12:15 PM  Between 7am to 6pm - Pager - [phone]  After 6pm go to [email or web address] - password [email or web address] [facility]  [facility]  Office  [phone]  CC: Primary care physician; Theresa Duty, MD

## 2016-09-17 NOTE — Progress Notes (Signed)
[facility] Medicine Progess Note    SYNOPSIS   Cathy Fox is a 61 year old female with a past medical history remarkable for antiphospholipid antibody syndrome, lumbar embolic disease, diabetes, gastroesophageal reflux disease, anxiety/depression, degenerative disc disease, anemia, hypertension, obstructive sleep apnea, presents with several days of not feeling well. She has had some cough congestion and green sputum production. Was brought in today by her sister who noticed a lump on the back of her neck. She was noted to have progressive dyspnea, in the emergency room she was noted to be severely hypoxemic with saturations in the 70s and 80%. Also noted to be hypotensive presently on her fourth liter of normal saline resuscitation. She is presently on noninvasive ventilation, awake alert and communicating.  ASSESSMENT/PLAN  Multi lobar pneumonia.  Significantly improved respiratory symptoms, on vancomycin and cefepime at this time. We'll continue.  Respiratory failure. COPD/obstructive sleep apnea and pneumonia. We'll continue nocturnal BiPAP, is on Solu-Medrol, albuterol, Atrovent, incentive spirometry  Septic shock. Has been weaned off of norepinephrine with stable blood pressures. Will remove central line  Leukocytosis. White count has resolved  Hypokalemia. We'll replace  Hyperglycemia. On coverage, most likely steroid-induced   Antiphospholipid antibody syndrome. On systemic anticoagulation  Urinary tract infection. On broad-spectrum coverage  At this point patient is doing well stable for floor transfer him  Intake/Output Summary (Last 24 hours) at 09/17/16 0942 Last data filed at 09/17/16 0915  Gross per 24 hour  Intake          2594.69 ml  Output             2535 ml  Net            59.69 ml   Name: Cathy Fox MRN: [id number] DOB: [birth year]    ADMISSION DATE:  09/16/2016  SUBJECTIVE:   Over the last 24 hours patient feels that her breathing is  improved. She has been weaned off of pressors and now only using BiPAP at night. She states that she has some CPAP for obstructive sleep apnea  Review of Systems:  Constitutional: Feels well. Cardiovascular: No chest pain.  Pulmonary: Denies dyspnea.   10 point review of systems was negative  other than what is documented in the HPI.    VITAL SIGNS: Temp:  [97.5 F (36.4 C)-98.7 F (37.1 C)] 97.5 F (36.4 C) (06/23 0745) Pulse Rate:  [70-100] 73 (06/23 0700) Resp:  [8-20] 13 (06/23 0700) BP: (89-144)/(54-95) 103/63 (06/23 0700) SpO2:  [92 %-97 %] 94 % (06/23 0745) FiO2 (%):  [30 %] 30 % (06/23 0700)   PHYSICAL EXAMINATION: Physical Examination:   VS: BP 103/63   Pulse 73   Temp 97.5 F (36.4 C)   Resp 13   Ht 5\' 5"  (1.651 m)   Wt 93.9 kg (207 lb 0.2 oz)   SpO2 94%   BMI 34.45 kg/m   General Appearance: No distress  Neuro:without focal findings, mental status normal. HEENT: PERRLA, EOM intact. Pulmonary: Prolonged expiratory phase, mild rhonchi appreciated, diminished breath sounds CardiovascularNormal S1,S2.  No m/r/g.   Abdomen: Benign, Soft, non-tender. Skin:   warm, no rashes, no ecchymosis  Extremities: normal, no cyanosis, clubbing.    LABORATORY PANEL:   CBC  Recent Labs Lab 09/17/16 0523  WBC 6.4  HGB 8.6*  HCT 26.7*  PLT 143*    Chemistries   Recent Labs Lab 09/16/16 1700 09/17/16 0523  NA  --  139  K 3.4* 3.2*  CL  --  113*  CO2  --  24  GLUCOSE  --  171*  BUN  --  9  CREATININE  --  0.45  CALCIUM  --  8.0*  MG 2.1  --   AST  --  45*  ALT  --  22  ALKPHOS  --  74  BILITOT  --  0.4     Recent Labs Lab 09/16/16 0702 09/16/16 1230 09/16/16 1614 09/16/16 2141 09/17/16 0734  GLUCAP 126* 153* 203* 184* 173*   No results for input(s): PHART, PCO2ART, PO2ART in the last 168 hours.  Recent Labs Lab 09/16/16 0445 09/17/16 0523  AST 72* 45*  ALT 25 22  ALKPHOS 97 74  BILITOT 0.6 0.4  ALBUMIN 3.1* 2.3*    Cardiac  Enzymes  Recent Labs Lab 09/16/16 1607  TROPONINI 0.15*    RADIOLOGY:  Ct Head Wo Contrast  Result Date: 09/16/2016 CLINICAL DATA:  Fall.  Neck swelling, on anticoagulation.  Fever. EXAM: CT HEAD WITHOUT CONTRAST TECHNIQUE: Contiguous axial images were obtained from the base of the skull through the vertex without intravenous contrast. COMPARISON:  None. FINDINGS: Brain: No evidence of acute infarction, hemorrhage, hydrocephalus, extra-axial collection or mass lesion/mass effect. Vascular: Atherosclerosis of skullbase vasculature without hyperdense vessel or abnormal calcification. Skull: No skull fracture or focal lesion. Sinuses/Orbits: Mild mucosal thickening of the ethmoid air cells. No fluid levels. Mastoid air cells are clear. Other: None. IMPRESSION: No acute intracranial abnormality. Electronically Signed   By: Rubye Oaks M.D.   On: 09/16/2016 06:17   Ct Soft Tissue Neck W Contrast  Result Date: 09/16/2016 CLINICAL DATA:  Initial evaluation for acute trauma, fall on Coumadin, neck swelling. EXAM: CT NECK WITH CONTRAST TECHNIQUE: Multidetector CT imaging of the neck was performed using the standard protocol following the bolus administration of intravenous contrast. CONTRAST:  33mL ISOVUE-300 IOPAMIDOL (ISOVUE-300) INJECTION 61% COMPARISON:  None available. FINDINGS: Pharynx and larynx: Gaseous distension of the oral cavity, likely related to the presence of a non rebreather. Oral cavity otherwise within normal limits without mass lesion or loculated fluid collection. Patient is edentulous. Palatine tonsils symmetric and normal. Parapharyngeal fat preserved. Nasopharynx normal. Retropharyngeal soft tissues within normal limits. Epiglottis normal. Vallecula largely clear. Remainder of the hypopharynx and supraglottic larynx within normal limits. True cords symmetric and within normal limits. Possible layering secretions within the subglottic trachea near the carina. Subglottic airway  otherwise clear. Salivary glands: Salivary glands including the parotid and submandibular glands are normal. Thyroid: Subcentimeter hypodense nodule noted within the right thyroid lobe, of doubtful significance. Thyroid otherwise unremarkable. Lymph nodes: No pathologically enlarged lymph nodes identified within the neck. Scattered subcentimeter mediastinal lymph nodes noted. Vascular: Normal intravascular enhancement seen throughout the neck. Atheromatous plaque about the right carotid bifurcation. Mild medial is a shin of the carotid artery's into the retropharyngeal space noted. Limited intracranial: Unremarkable. Visualized orbits: Visualized globes and orbital soft tissues within normal limits. Mastoids and visualized paranasal sinuses: Scattered mucosal thickening within the right maxillary sinus and visualized ethmoidal air cells. Visualized paranasal sinuses otherwise clear. Visualize mastoids and middle ear cavities are clear. Skeleton: No acute osseous abnormality. No worrisome lytic or blastic osseous lesions. Moderate degenerative spondylolysis present at C5-6 and C6-7. Upper chest: Multifocal nodular and patchy opacities within the visualized upper lobes, left greater than right patchy involvement within the superior segment left lower lobe. Findings concerning for possible multifocal infection. Other: No other acute abnormality within the neck. No discrete soft tissue hematoma or evidence for traumatic  injury. IMPRESSION: 1. No acute abnormality identified within the neck. No evidence for traumatic injury status post recent fall. 2. Multifocal nodular and ground-glass opacities within the visualized lungs, left greater than right, concerning for multifocal pneumonia/infection. 3. Layering secretions within the subglottic trachea. Finding suggests that this patient may be at risk for aspiration. Electronically Signed   By: Rise Mu M.D.   On: 09/16/2016 06:55   Dg Chest Port 1  View  Result Date: 09/16/2016 CLINICAL DATA:  Status post central line placement EXAM: PORTABLE CHEST 1 VIEW COMPARISON:  09/16/2016 FINDINGS: Cardiac shadow is stable. New left jugular central line is seen with the catheter tip at the cavoatrial junction. Bibasilar changes are again noted and stable. No pneumothorax is seen. No bony abnormality is noted. IMPRESSION: No pneumothorax following central line placement. Electronically Signed   By: Alcide Clever M.D.   On: 09/16/2016 11:59   Dg Chest Port 1 View  Result Date: 09/16/2016 CLINICAL DATA:  Cough and hypoxia. EXAM: PORTABLE CHEST 1 VIEW COMPARISON:  01/05/2016 FINDINGS: The lungs are hyperinflated. Central bronchitic changes are stable. Ill-defined heterogeneous bibasilar opacities are unchanged from prior exam. Heart size is unchanged at the upper limits of normal. Unchanged mediastinal contours. No pleural fluid. No pneumothorax. No acute osseous abnormality. IMPRESSION: Chronic hyperinflation and bronchial thickening. Heterogeneous bibasilar opacities are unchanged from prior exam, may be atelectasis or scarring. Electronically Signed   By: Rubye Oaks M.D.   On: 09/16/2016 05:21   Tora Kindred, DO [place] [facility] Office Number: [phone]   09/17/2016

## 2016-09-17 NOTE — Progress Notes (Signed)
MD notified of pt request for home medication of valium to be ordered d/t currently not ordered. MD to put in order. Will cont to monitor

## 2016-09-18 ENCOUNTER — Inpatient Hospital Stay
Admit: 2016-09-18 | Discharge: 2016-09-18 | Disposition: A | Discharge status: Home / Self Care | Payer: Medicare Other | Attending: Internal Medicine | Admitting: Internal Medicine

## 2016-09-18 LAB — CBC
HCT: 27.6 % — ABNORMAL LOW (ref 35.0–47.0)
Hemoglobin: 9 g/dL — ABNORMAL LOW (ref 12.0–16.0)
MCH: 24.3 pg — ABNORMAL LOW (ref 26.0–34.0)
MCHC: 32.4 g/dL (ref 32.0–36.0)
MCV: 74.8 fL — ABNORMAL LOW (ref 80.0–100.0)
PLATELETS: 148 10*3/uL — AB (ref 150–440)
RBC: 3.69 MIL/uL — ABNORMAL LOW (ref 3.80–5.20)
RDW: 25.3 % — AB (ref 11.5–14.5)
WBC: 7.5 10*3/uL (ref 3.6–11.0)

## 2016-09-18 LAB — ECHOCARDIOGRAM COMPLETE
Area-P 1/2: 6.29 cm2
E decel time: 120 msec
E/e' ratio: 10.17
FS: 37 % (ref 28–44)
HEIGHTINCHES: 65 in
IV/PV OW: 0.82
LA diam end sys: 48 mm
LA vol A4C: 85.2 ml
LADIAMINDEX: 2.27 cm/m2
LASIZE: 48 mm
LAVOL: 89.5 mL
LAVOLIN: 42.3 mL/m2
LV E/e' medial: 10.17
LV e' LATERAL: 12 cm/s
LVEEAVG: 10.17
Lateral S' vel: 16.4 cm/s
MV Dec: 120
MV Peak grad: 6 mmHg
MV pk A vel: 86.4 m/s
MV pk E vel: 122 m/s
MVSPHT: 35 ms
PW: 7.06 mm — AB (ref 0.6–1.1)
TAPSE: 25.8 mm
TDI e' lateral: 12
TDI e' medial: 7.62
WEIGHTICAEL: 3312.19 [oz_av]

## 2016-09-18 LAB — URINE CULTURE

## 2016-09-18 LAB — GLUCOSE, CAPILLARY
GLUCOSE-CAPILLARY: 197 mg/dL — AB (ref 65–99)
GLUCOSE-CAPILLARY: 221 mg/dL — AB (ref 65–99)
Glucose-Capillary: 206 mg/dL — ABNORMAL HIGH (ref 65–99)
Glucose-Capillary: 194 mg/dL — ABNORMAL HIGH (ref 65–99)

## 2016-09-18 MED ORDER — WARFARIN - PHARMACIST DOSING INPATIENT: Freq: Every day | Status: DC

## 2016-09-18 MED ORDER — SUMATRIPTAN SUCCINATE 50 MG PO TABS
50.0000 mg | ORAL_TABLET | Freq: Once | ORAL | Status: AC
  Administered 2016-09-18: 50 mg via ORAL
  Filled 2016-09-18: qty 1

## 2016-09-18 MED ORDER — WARFARIN SODIUM 5 MG PO TABS: 5.0000 mg | ORAL_TABLET | ORAL | Status: DC

## 2016-09-18 MED ORDER — DIAZEPAM 5 MG PO TABS
7.5000 mg | ORAL_TABLET | Freq: Three times a day (TID) | ORAL | Status: DC | PRN
  Administered 2016-09-18 – 2016-09-19 (×3): 7.5 mg via ORAL
  Filled 2016-09-18 (×3): qty 2

## 2016-09-18 MED ORDER — WARFARIN SODIUM 10 MG PO TABS
10.0000 mg | ORAL_TABLET | ORAL | Status: AC
  Administered 2016-09-18: 10 mg via ORAL
  Filled 2016-09-18: qty 1

## 2016-09-18 NOTE — Progress Notes (Signed)
[facility] - [place] at [facility]   PATIENT NAME: Cathy Fox    MR#:  [id number]  DATE OF BIRTH:  06-05-1955  SUBJECTIVE:  CHIEF COMPLAINT:   Chief Complaint  Patient presents with  . Cough  . Weakness  Patient is a 61 year old Caucasian female with history of antiphospholipid antibody syndrome, diabetes, gastroesophageal reflux disease, anxiety, depression, obstructive sleep apnea, who presents to the hospital with complaints of cough, chest congestion, green sputum production, shortness of breath. In emergency room, she was hypoxic with O2 sats in 70s to 80s, she was also noted to be hypotensive requiring 4 L of normal saline resuscitation. She was admitted to intensive care unit for noninvasive ventilation. Chest x-ray was unremarkable, however, patient's soft tissue CT revealed multifocal pneumonia. Patient is on cefepime at present is feels relatively good, denies significant sputum production, feels better. Blood pressure has improved as well as oxygenation, now off BiPAP, on 2 L of oxygen nasal cannula with O2 sats ranging from 94-96%. Echocardiogram is pending. Patient was on nocturnal oxygen only at home. Urine culture revealed 100,000 colony-forming units of Klebsiella pneumonia, sensitive to cefazolin. Blood cultures are negative, MRSA PCR is negative. Patient is in on cefepime at present.    Review of Systems  Constitutional: Negative for chills, fever and weight loss.  HENT: Negative for congestion.   Eyes: Negative for blurred vision and double vision.  Respiratory: Positive for cough, sputum production, shortness of breath and wheezing.   Cardiovascular: Negative for chest pain, palpitations, orthopnea, leg swelling and PND.  Gastrointestinal: Negative for abdominal pain, blood in stool, constipation, diarrhea, nausea and vomiting.  Genitourinary: Negative for dysuria, frequency, hematuria and urgency.  Musculoskeletal: Negative for falls.    Neurological: Negative for dizziness, tremors, focal weakness and headaches.  Endo/Heme/Allergies: Does not bruise/bleed easily.  Psychiatric/Behavioral: Negative for depression. The patient does not have insomnia.     VITAL SIGNS: Blood pressure (!) 145/65, pulse 79, temperature 97.8 F (36.6 C), temperature source Oral, resp. rate 18, height 5\' 5"  (1.651 m), weight 93.9 kg (207 lb 0.2 oz), SpO2 96 %.  PHYSICAL EXAMINATION:   GENERAL:  61 y.o.-year-old patient lying in the bed in mild  respiratory distress, but overall feels more comfortable.  EYES: Pupils equal, round, reactive to light and accommodation. No scleral icterus. Extraocular muscles intact.  HEENT: Head atraumatic, normocephalic. Oropharynx and nasopharynx clear.  NECK:  Supple, no jugular venous distention. No thyroid enlargement, no tenderness.  LUNGS: Some improved breath sounds bilaterally, scattered wheezing, rales,rhonchi , but no crepitations. No use of accessory muscles of respiration.  CARDIOVASCULAR: S1, S2 normal. No murmurs, rubs, or gallops.  ABDOMEN: Soft, nontender, nondistended. Bowel sounds present. No organomegaly or mass.  EXTREMITIES: No pedal edema, cyanosis, or clubbing.  NEUROLOGIC: Cranial nerves II through XII are intact. Muscle strength 5/5 in all extremities. Sensation intact. Gait not checked.  PSYCHIATRIC: The patient is alert and oriented x 3.  SKIN: No obvious rash, lesion, or ulcer.   ORDERS/RESULTS REVIEWED:   CBC  Recent Labs Lab 09/16/16 0445 09/17/16 0523 09/18/16 0325  WBC 14.4* 6.4 7.5  HGB 10.7* 8.6* 9.0*  HCT 33.1* 26.7* 27.6*  PLT 180 143* 148*  MCV 74.4* 75.2* 74.8*  MCH 24.0* 24.1* 24.3*  MCHC 32.3 32.1 32.4  RDW 24.8* 25.0* 25.3*  LYMPHSABS 1.7  --   --   MONOABS 0.1*  --   --   EOSABS 0.0  --   --   BASOSABS  0.1  --   --    ------------------------------------------------------------------------------------------------------------------  Chemistries   Recent  Labs Lab 09/16/16 0445 09/16/16 0746 09/16/16 1700 09/17/16 0523 09/17/16 1126  NA 133*  --   --  139 138  K 2.9*  --  3.4* 3.2* 3.5  CL 101  --   --  113* 111  CO2 24  --   --  24 23  GLUCOSE 108*  --   --  171* 207*  BUN 23*  --   --  9 11  CREATININE 0.98  --   --  0.45 0.46  CALCIUM 8.4*  --   --  8.0* 8.3*  MG  --  1.3* 2.1  --   --   AST 72*  --   --  45* 45*  ALT 25  --   --  22 22  ALKPHOS 97  --   --  74 77  BILITOT 0.6  --   --  0.4 0.5   ------------------------------------------------------------------------------------------------------------------ estimated creatinine clearance is 83.7 mL/min (by C-G formula based on SCr of 0.46 mg/dL). ------------------------------------------------------------------------------------------------------------------ No results for input(s): TSH, T4TOTAL, T3FREE, THYROIDAB in the last 72 hours.  Invalid input(s): FREET3  Cardiac Enzymes  Recent Labs Lab 09/16/16 0445 09/16/16 0746 09/16/16 1607  TROPONINI 0.41* 0.29* 0.15*   ------------------------------------------------------------------------------------------------------------------ Invalid input(s): POCBNP ---------------------------------------------------------------------------------------------------------------  RADIOLOGY: Dg Chest Port 1 View  Result Date: 09/16/2016 CLINICAL DATA:  Status post central line placement EXAM: PORTABLE CHEST 1 VIEW COMPARISON:  09/16/2016 FINDINGS: Cardiac shadow is stable. New left jugular central line is seen with the catheter tip at the cavoatrial junction. Bibasilar changes are again noted and stable. No pneumothorax is seen. No bony abnormality is noted. IMPRESSION: No pneumothorax following central line placement. Electronically Signed   By: Alcide Clever M.D.   On: 09/16/2016 11:59    EKG:  Orders placed or performed during the hospital encounter of 09/16/16  . ED EKG 12-Lead  . ED EKG 12-Lead  . EKG 12-Lead  . EKG 12-Lead     ASSESSMENT AND PLAN:  Active Problems:   Acute respiratory failure (HCC)  #1. Acute respiratory failure with hypoxia due to COPD exacerbation and pneumonia, continue oxygen therapy, wean off oxygen as tolerated, Now on 2 L of oxygen with relatively good oxygen saturations. #2. Multifocal pneumonia, get sputum cultures, if possible, blood cultures are negative so far, continue cefepime intravenously, now off vancomycin, patient remains afebrile, white blood cell count remains stable #3. Hyponatremia, resolved #4. Hypokalemia, improved #5. Elevated troponin, likely demand ischemia, improving, awaiting for echocardiogram #6. Leukocytosis, resolved #7.  urinary tract infection due to Klebsiella pneumonia, cultures are pending, continue cefepime for now, change to Keflex upon discharge #8. Generalized weakness, get physical therapist evaluation  Management plans discussed with the patient, family and they are in agreement.   DRUG ALLERGIES: No Known Allergies  CODE STATUS:     Code Status Orders        Start     Ordered   09/16/16 0648  Full code  Continuous     09/16/16 0647    Code Status History    Date Active Date Inactive Code Status Order ID Comments User Context   01/05/2016  7:58 PM 01/07/2016  2:51 PM Full Code [id number]  Auburn Bilberry, MD Inpatient      TOTAL TIME TAKING CARE OF THIS PATIENT: 35 minutes.    Katharina Caper M.D on 09/18/2016 at 10:55 AM  Between 7am to 6pm - Pager -  [phone]  After 6pm go to [email or web address] - password [email or web address] [facility]  [place] [facility]  Office  [phone]  CC: Primary care physician; Theresa Duty, MD

## 2016-09-18 NOTE — Progress Notes (Signed)
ANTICOAGULATION CONSULT NOTE - Initial Consult  Pharmacy Consult for warfarin Indication: h/o DVT/PE  No Known Allergies  Patient Measurements: Height: 5\' 5"  (165.1 cm) Weight: 207 lb 0.2 oz (93.9 kg) IBW/kg (Calculated) : 57   Vital Signs: Temp: 98.6 F (37 C) (06/24 1432) Temp Source: Oral (06/24 1432) BP: 138/69 (06/24 1432) Pulse Rate: 76 (06/24 1432)  Labs:  Recent Labs  09/16/16 0445 09/16/16 0746 09/16/16 1607 09/17/16 0523 09/17/16 1126 09/18/16 0325  HGB 10.7*  --   --  8.6*  --  9.0*  HCT 33.1*  --   --  26.7*  --  27.6*  PLT 180  --   --  143*  --  148*  LABPROT 24.1*  --   --  28.0*  --   --   INR 2.12  --   --  2.56  --   --   CREATININE 0.98  --   --  0.45 0.46  --   TROPONINI 0.41* 0.29* 0.15*  --   --   --     Estimated Creatinine Clearance: 83.7 mL/min (by C-G formula based on SCr of 0.46 mg/dL).   Medical History: Past Medical History:  Diagnosis Date  . Anemia   . Anxiety   . Arthritis   . DDD (degenerative disc disease), cervical   . Depression   . Diabetes mellitus without complication (HCC)   . DVT (deep venous thrombosis) (HCC)   . GERD (gastroesophageal reflux disease)   . Headache   . History of pulmonary embolus (PE) 2005  . Hx of antiphospholipid antibody syndrome   . Hypertension   . Hypotension   . Pancreatitis [address] [place]  . Shortness of breath dyspnea   . Sleep apnea   . Vertigo    Assessment: 61 y/o F with a h/o DVT/PE on coumadin 5/10 mg alternating dose PTA ordered Lovenox on admission for r/o ACS.   Goal of Therapy:  INR 2-3  Plan:  Will continue PTA dosing of warfarin and f/u AM INR.   7/10 09/18/2016,3:06 PM

## 2016-09-18 NOTE — Progress Notes (Signed)
Physical Therapy Evaluation Patient Details Name: Cathy Fox MRN: [id number] DOB: 1956/03/16 Today's Date: 09/18/2016   History of Present Illness  Patient is a 61 y.o. female admitted on 22 JUN for increasing SOB/cough for past few days. Found to be hypoxic and have bilateral pneumonia, as well as UTI. PMH includes DDD, arthritis, anemia, DMII, DVT/PE, and CVA (APR 2018).  Clinical Impression  Patient is a pleasant female admitted for above listed reasons. Patient reportedly just finished HHPT about 2 weeks ago and was modified independent with occasional use of RW as needed following CVA in APR. Patient lives alone, as her husband recently passed in [place] reportedly. Patient does not use home O2; however, she admits to use of BiPAP at night. On evaluation, patient demonstrates good strength and safety awareness. She was able to ambulate 57' with 2L O2 and saturations remaining >95%. Upon removal for following 50', patient's oxygen saturations dropped to 89% and quickly returning to baseline with reapplication of oxygen. Because this is not patient's PLOF, it is believed that she will continue to benefit from skilled and progressive cardiopulmonary endurance training and possible HHPT f/u upon d/c when medically ready to prevent need for home oxygen.    Follow Up Recommendations Home health PT    Equipment Recommendations  None recommended by PT    Recommendations for Other Services       Precautions / Restrictions Precautions Precautions: Fall Restrictions Weight Bearing Restrictions: No      Mobility  Bed Mobility Overal bed mobility: Independent             General bed mobility comments: Patient performs bed mobility independently.  Transfers Overall transfer level: Modified independent Equipment used: Rolling walker (2 wheeled)             General transfer comment: Patient moves from sit to stand and stand to sit with good  control.  Ambulation/Gait Ambulation/Gait assistance: Supervision Ambulation Distance (Feet): 100 Feet Assistive device: Rolling walker (2 wheeled)       General Gait Details: Patient ambulates with RW at decreased cadence. Oxygen saturations >95% on 2L and HR 100 bpm. Removed oxygen after ambulating 50'. Oxygen saturations dropped to 89% at conclustion of walk with HR 107 bpm.  Stairs            Wheelchair Mobility    Modified Rankin (Stroke Patients Only)       Balance Overall balance assessment: Modified Independent                                           Pertinent Vitals/Pain Pain Assessment: No/denies pain    Home Living Family/patient expects to be discharged to:: Private residence Living Arrangements: Alone Available Help at Discharge: Family;Available PRN/intermittently Type of Home: House Home Access: Stairs to enter Entrance Stairs-Rails: None Entrance Stairs-Number of Steps: 5 Home Layout: One level Home Equipment: Walker - 2 wheels;Wheelchair - manual;Grab bars - tub/shower      Prior Function Level of Independence: Independent with assistive device(s)         Comments: Patient uses RW as needed.     Hand Dominance        Extremity/Trunk Assessment   Upper Extremity Assessment Upper Extremity Assessment: Overall WFL for tasks assessed    Lower Extremity Assessment Lower Extremity Assessment: Overall WFL for tasks assessed       Communication  Communication: No difficulties  Cognition Arousal/Alertness: Awake/alert Behavior During Therapy: WFL for tasks assessed/performed Overall Cognitive Status: Within Functional Limits for tasks assessed                                        General Comments      Exercises     Assessment/Plan    PT Assessment Patient needs continued PT services  PT Problem List Decreased activity tolerance;Decreased mobility;Cardiopulmonary status limiting  activity       PT Treatment Interventions Gait training;Stair training;Functional mobility training;Therapeutic activities;Therapeutic exercise;Patient/family education    PT Goals (Current goals can be found in the Care Plan section)  Acute Rehab PT Goals Patient Stated Goal: "To get better" PT Goal Formulation: With patient/family Time For Goal Achievement: 10/02/16 Potential to Achieve Goals: Good    Frequency Min 2X/week   Barriers to discharge        Co-evaluation               AM-PAC PT "6 Clicks" Daily Activity  Outcome Measure Difficulty turning over in bed (including adjusting bedclothes, sheets and blankets)?: None Difficulty moving from lying on back to sitting on the side of the bed? : None Difficulty sitting down on and standing up from a chair with arms (e.g., wheelchair, bedside commode, etc,.)?: None Help needed moving to and from a bed to chair (including a wheelchair)?: None Help needed walking in hospital room?: A Little Help needed climbing 3-5 steps with a railing? : A Little 6 Click Score: 22    End of Session Equipment Utilized During Treatment: Gait belt;Oxygen Activity Tolerance: Patient tolerated treatment well Patient left: in chair;with call bell/phone within reach;with chair alarm set;with family/visitor present   PT Visit Diagnosis: Difficulty in walking, not elsewhere classified (R26.2)    Time: 3016-0109 PT Time Calculation (min) (ACUTE ONLY): 23 min   Charges:   PT Evaluation $PT Eval Low Complexity: 1 Procedure     PT G Codes:         Neita Carp, PT, DPT 09/18/2016, 3:00 PM

## 2016-09-18 NOTE — Progress Notes (Signed)
MD paged for pt complaints of headache not relieved by tylenol and normally takes imatrex at home. Willis MD to put in order. Will cont to monitor

## 2016-09-19 DIAGNOSIS — B961 Klebsiella pneumoniae [K. pneumoniae] as the cause of diseases classified elsewhere: Secondary | ICD-10-CM

## 2016-09-19 DIAGNOSIS — N3090 Cystitis, unspecified without hematuria: Secondary | ICD-10-CM

## 2016-09-19 DIAGNOSIS — Z716 Tobacco abuse counseling: Secondary | ICD-10-CM

## 2016-09-19 DIAGNOSIS — R778 Other specified abnormalities of plasma proteins: Secondary | ICD-10-CM

## 2016-09-19 DIAGNOSIS — B9689 Other specified bacterial agents as the cause of diseases classified elsewhere: Secondary | ICD-10-CM

## 2016-09-19 DIAGNOSIS — R7989 Other specified abnormal findings of blood chemistry: Secondary | ICD-10-CM

## 2016-09-19 DIAGNOSIS — D72829 Elevated white blood cell count, unspecified: Secondary | ICD-10-CM

## 2016-09-19 DIAGNOSIS — E871 Hypo-osmolality and hyponatremia: Secondary | ICD-10-CM

## 2016-09-19 DIAGNOSIS — R531 Weakness: Secondary | ICD-10-CM

## 2016-09-19 DIAGNOSIS — G43909 Migraine, unspecified, not intractable, without status migrainosus: Secondary | ICD-10-CM

## 2016-09-19 DIAGNOSIS — E876 Hypokalemia: Secondary | ICD-10-CM

## 2016-09-19 LAB — PROTIME-INR
INR: 1.3
Prothrombin Time: 16.3 seconds — ABNORMAL HIGH (ref 11.4–15.2)

## 2016-09-19 LAB — BLOOD GAS, VENOUS
ACID-BASE DEFICIT: 2.9 mmol/L — AB (ref 0.0–2.0)
BICARBONATE: 23.2 mmol/L (ref 20.0–28.0)
O2 Saturation: 83.2 %
PATIENT TEMPERATURE: 37
pCO2, Ven: 45 mmHg (ref 44.0–60.0)
pH, Ven: 7.32 (ref 7.250–7.430)
pO2, Ven: 52 mmHg — ABNORMAL HIGH (ref 32.0–45.0)

## 2016-09-19 LAB — GLUCOSE, CAPILLARY
GLUCOSE-CAPILLARY: 174 mg/dL — AB (ref 65–99)
Glucose-Capillary: 171 mg/dL — ABNORMAL HIGH (ref 65–99)
Glucose-Capillary: 186 mg/dL — ABNORMAL HIGH (ref 65–99)

## 2016-09-19 MED ORDER — LISINOPRIL 10 MG PO TABS
10.0000 mg | ORAL_TABLET | Freq: Every day | ORAL | Status: DC
  Administered 2016-09-19: 10 mg via ORAL
  Filled 2016-09-19: qty 1

## 2016-09-19 MED ORDER — SUMATRIPTAN SUCCINATE 50 MG PO TABS
50.0000 mg | ORAL_TABLET | Freq: Once | ORAL | Status: AC
  Administered 2016-09-19: 50 mg via ORAL
  Filled 2016-09-19: qty 1

## 2016-09-19 MED ORDER — WARFARIN SODIUM 10 MG PO TABS: 10.0000 mg | ORAL_TABLET | ORAL | Status: DC

## 2016-09-19 MED ORDER — BUDESONIDE-FORMOTEROL FUMARATE 160-4.5 MCG/ACT IN AERO: 2.0000 | INHALATION_SPRAY | Freq: Two times a day (BID) | RESPIRATORY_TRACT | 12 refills | Status: DC

## 2016-09-19 MED ORDER — ALBUTEROL SULFATE (2.5 MG/3ML) 0.083% IN NEBU: 2.5000 mg | INHALATION_SOLUTION | Freq: Four times a day (QID) | RESPIRATORY_TRACT | 12 refills | Status: DC | PRN

## 2016-09-19 MED ORDER — LISINOPRIL 10 MG PO TABS
10.0000 mg | ORAL_TABLET | Freq: Once | ORAL | Status: AC
  Administered 2016-09-19: 10 mg via ORAL
  Filled 2016-09-19: qty 1

## 2016-09-19 MED ORDER — METOPROLOL TARTRATE 25 MG PO TABS
25.0000 mg | ORAL_TABLET | Freq: Once | ORAL | Status: AC
  Administered 2016-09-19: 25 mg via ORAL
  Filled 2016-09-19: qty 1

## 2016-09-19 MED ORDER — WARFARIN SODIUM 10 MG PO TABS
10.0000 mg | ORAL_TABLET | Freq: Once | ORAL | Status: AC
  Administered 2016-09-19: 10 mg via ORAL
  Filled 2016-09-19: qty 1

## 2016-09-19 MED ORDER — WARFARIN SODIUM 5 MG PO TABS
5.0000 mg | ORAL_TABLET | ORAL | Status: DC
  Filled 2016-09-19: qty 1

## 2016-09-19 MED ORDER — FLUOXETINE HCL 20 MG PO CAPS: 60.0000 mg | ORAL_CAPSULE | Freq: Every day | ORAL | 3 refills | Status: DC

## 2016-09-19 MED ORDER — FLUOXETINE HCL 20 MG PO CAPS
60.0000 mg | ORAL_CAPSULE | Freq: Every day | ORAL | Status: DC
  Administered 2016-09-19: 60 mg via ORAL
  Filled 2016-09-19: qty 3

## 2016-09-19 MED ORDER — ENOXAPARIN SODIUM 100 MG/ML ~~LOC~~ SOLN
1.0000 mg/kg | Freq: Once | SUBCUTANEOUS | Status: AC
  Administered 2016-09-19: 95 mg via SUBCUTANEOUS
  Filled 2016-09-19: qty 1

## 2016-09-19 MED ORDER — CEFDINIR 300 MG PO CAPS: 300.0000 mg | ORAL_CAPSULE | Freq: Two times a day (BID) | ORAL | 0 refills | Status: DC

## 2016-09-19 MED ORDER — LISINOPRIL 20 MG PO TABS: 20.0000 mg | ORAL_TABLET | Freq: Every day | ORAL | 11 refills | Status: DC

## 2016-09-19 MED ORDER — GUAIFENESIN ER 600 MG PO TB12: 600.0000 mg | ORAL_TABLET | Freq: Two times a day (BID) | ORAL | 2 refills | Status: DC

## 2016-09-19 MED ORDER — PREDNISONE 10 MG (21) PO TBPK: ORAL_TABLET | ORAL | 0 refills | Status: DC

## 2016-09-19 MED ORDER — ORPHENADRINE CITRATE ER 100 MG PO TB12: 100.0000 mg | ORAL_TABLET | Freq: Two times a day (BID) | ORAL | 3 refills | Status: DC

## 2016-09-19 MED ORDER — NICOTINE 21 MG/24HR TD PT24
21.0000 mg | MEDICATED_PATCH | Freq: Every day | TRANSDERMAL | Status: DC
  Administered 2016-09-19: 21 mg via TRANSDERMAL
  Filled 2016-09-19: qty 1

## 2016-09-19 MED ORDER — LISINOPRIL 10 MG PO TABS: 10.0000 mg | ORAL_TABLET | Freq: Every day | ORAL | 3 refills | Status: DC

## 2016-09-19 MED ORDER — AZITHROMYCIN 250 MG PO TABS: ORAL_TABLET | ORAL | 0 refills | Status: DC

## 2016-09-19 MED ORDER — DEXTROMETHORPHAN POLISTIREX ER 30 MG/5ML PO SUER: 30.0000 mg | Freq: Two times a day (BID) | ORAL | 0 refills | Status: DC

## 2016-09-19 MED ORDER — WARFARIN SODIUM 5 MG PO TABS: 5.0000 mg | ORAL_TABLET | ORAL | Status: DC

## 2016-09-19 NOTE — Care Management Note (Addendum)
Case Management Note  Patient Details  Name: Cathy Fox MRN: [id number] Date of Birth: 12/26/1955  Subjective/Objective:   Admitted with multifocal PNA and UTI from home where she lives alone. She uses a cane and walker at baseline. She is able to provide her own adls.  Has BiPAP at night with O2 @ 5L bleed in. Supplied by [employer].  PCP is Dr. Gabriel Rainwater  at [facility]. Her son assists her with transportation when needed. PT recommending home PT. She would benefit from nursing for disease management. Discussed discharge planning with patient. She is open to home health. Recently discharged from [facility]. Patient would like to have them again. Referral to [facility] [place] [facility] for nursing and PT. No DME needs.               Action/Plan: Will continue to follow progression. Monitor for continuous home O2.need  Expected Discharge Date:                  Expected Discharge Plan:  Home w Home Health Services  In-House Referral:     Discharge planning Services  CM Consult  Post Acute Care Choice:  Home Health Choice offered to:     DME Arranged:   [place] DME Agency:   [facility]  HH Arranged:  RN, PT HH Agency:  [facility]  Status of Service:  In process, will continue to follow  If discussed at Long Length of Stay Meetings, dates discussed:    Additional Comments:  Marily Memos, RN 09/19/2016, 8:35 AM

## 2016-09-19 NOTE — Care Management Note (Signed)
Case Management Note  Patient Details  Name: Cathy Fox MRN: [id number] Date of Birth: [birth year]  Subjective/Objective: Case dicussed with attending. Discharging today.                  Action/Plan: Requested primary nurse obtain qualify O2 sats for continuous home O2. [facility] notified of discharge.  Will add HHA. Will follow up after O2 sats assessment.   Expected Discharge Date:    09/19/2016              Expected Discharge Plan:  Home w Home Health Services  In-House Referral:     Discharge planning Services  CM Consult  Post Acute Care Choice:  Home Health Choice offered to:     DME Arranged:    DME Agency:     HH Arranged:  RN, PT, Nurse's Aide HH Agency:  [facility]  Status of Service:  Completed, signed off  If discussed at Long Length of Stay Meetings, dates discussed:    Additional Comments:  Marily Memos, RN 09/19/2016, 9:27 AM

## 2016-09-19 NOTE — Progress Notes (Signed)
Pts BP remains elevated after Lisinopril, MD [place] notified. Per MD Give Metoprolol 25 mg now.

## 2016-09-19 NOTE — Care Management Note (Signed)
Case Management Note  Patient Details  Name: Cathy Fox MRN: [id number] Date of Birth: 04-26-1955  Subjective/Objective:  Patient did not qualify for continuous home O2. Patient made aware by primary RN.                   Action/Plan:   Expected Discharge Date:  09/19/16               Expected Discharge Plan:  Home w Home Health Services  In-House Referral:     Discharge planning Services  CM Consult  Post Acute Care Choice:  Home Health Choice offered to:     DME Arranged:    DME Agency:     HH Arranged:  RN, PT, Nurse's Aide HH Agency:  [facility]  Status of Service:  Completed, signed off  If discussed at Long Length of Stay Meetings, dates discussed:    Additional Comments:  Marily Memos, RN 09/19/2016, 11:52 AM

## 2016-09-19 NOTE — Progress Notes (Signed)
Pts BP 130/61, MD [place] notified. Per ok pt ok to D/C.

## 2016-09-19 NOTE — Discharge Summary (Addendum)
[facility] - [place] at [facility]   PATIENT NAME: Cathy Fox    MR#:  [id number]  DATE OF BIRTH:  1955-04-13  DATE OF ADMISSION:  09/16/2016 ADMITTING PHYSICIAN: Ihor Austin, MD  DATE OF DISCHARGE: No discharge date for patient encounter.  PRIMARY CARE PHYSICIAN: Theresa Duty, MD     ADMISSION DIAGNOSIS:  Other specified hypotension [I95.89] Healthcare-associated pneumonia [J18.9] Acute cystitis with hematuria [N30.01] Sepsis, due to unspecified organism (HCC) [A41.9]  DISCHARGE DIAGNOSIS:  Active Problems:   Pneumonia   Acute respiratory failure (HCC)   Hyponatremia   Hypokalemia   Leukocytosis   Elevated troponin   Klebsiella cystitis   Generalized weakness   Tobacco abuse counseling   Migraine headache   SECONDARY DIAGNOSIS:   Past Medical History:  Diagnosis Date  . Anemia   . Anxiety   . Arthritis   . DDD (degenerative disc disease), cervical   . Depression   . Diabetes mellitus without complication (HCC)   . DVT (deep venous thrombosis) (HCC)   . GERD (gastroesophageal reflux disease)   . Headache   . History of pulmonary embolus (PE) 2005  . Hx of antiphospholipid antibody syndrome   . Hypertension   . Hypotension   . Pancreatitis [address] [place]  . Shortness of breath dyspnea   . Sleep apnea   . Vertigo     .pro HOSPITAL COURSE:   Patient is 61 year old Caucasian female with past medical history significant for history of pulmonary embolus, antiphospholipid antibody syndrome, chronic dyspnea, depression, diabetes, obstructive sleep apnea, who is on BiPAP at home, who presents to the hospital with complaints of difficulty breathing and cough. She was noted to be hypoxic and was placed on BiPAP. Chest x-ray revealed bibasilar changes, chronic hyperinflation and bronchial thickening. CT of soft tissues of the neck with contrast revealed multifocal pneumonia in the lungs, no acute abnormalities in  the neck, layering secretions in subglottic trachea, concerning of aspiration. Patient was admitted to intensive care unit number initiated on broad-spectrum antibiotic therapy, nebulizing therapy, steroids and clinically improved. She was weaned off of oxygen and her oxygen saturation remained at 96% on room air. She however qualified for oxygen therapy at home with ambulation. She was also noted to have pyuria, white blood cell clumps and a urinalysis, urine cultures revealed Klebsiella pneumonia more than 100,000 colony-forming units, sensitive to cefazolin, ceftriaxone, ciprofloxacin, gentamicin, imipenem, nitrofurantoin, Zosyn, Septra. She was felt to be stable to be discharged home on tapering steroid doses, inhalation therapy, antibiotics , Cefdinir and Zithromax. Discussion by problem: #1. Acute respiratory failure with hypoxia due to COPD exacerbation and pneumonia, the patient is to continue oxygen therapy at 2 L of oxygen at daytime, BiPAP at night. She qualified for home oxygen use at the time. She is to follow-up with her primary care physician and to wean off oxygen as tolerated. #2. Multifocal pneumonia, no sputum cultures were reported,, blood cultures were negative, continue cefdinir and zithromax for 5 more days orally to complete the course, white blood cell count remains normal #3. Hyponatremia, resolved #4. Hypokalemia, improved #5. Elevated troponin, likely demand ischemia, improved,  echocardiogram was normal, normal ejection fraction #6. Leukocytosis, resolved #7.  urinary tract infection due to Klebsiella pneumonia,  continue  cefdinir  #8. Generalized weakness, physical therapist evaluated patient and recommended home health services, including nurse, physical therapy, nurses aide #9. Malignant essential hypertension, resume metoprolol, advance medications as needed, lisinopril added, may need  to be advanced #10, headache, questionably related to hypertension, follow with  hypertension treatment, continue outpatient medications #11. Tobacco abuse counseling, discussed this patient for 4 minutes, nicotine replacement therapy to be continued DISCHARGE CONDITIONS:   Stable  CONSULTS OBTAINED:    DRUG ALLERGIES:  No Known Allergies  DISCHARGE MEDICATIONS:   Current Discharge Medication List    START taking these medications   Details  albuterol (PROVENTIL) (2.5 MG/3ML) 0.083% nebulizer solution Take 3 mLs (2.5 mg total) by nebulization every 6 (six) hours as needed for wheezing or shortness of breath. Qty: 180 vial, Refills: 12    azithromycin (ZITHROMAX Z-PAK) 250 MG tablet Take as directed Qty: 6 each, Refills: 0    budesonide-formoterol (SYMBICORT) 160-4.5 MCG/ACT inhaler Inhale 2 puffs into the lungs 2 (two) times daily. Qty: 1 Inhaler, Refills: 12    cefdinir (OMNICEF) 300 MG capsule Take 1 capsule (300 mg total) by mouth 2 (two) times daily. Qty: 10 capsule, Refills: 0    dextromethorphan (DELSYM) 30 MG/5ML liquid Take 5 mLs (30 mg total) by mouth 2 (two) times daily. Qty: 89 mL, Refills: 0    FLUoxetine (PROZAC) 20 MG capsule Take 3 capsules (60 mg total) by mouth daily. Qty: 90 capsule, Refills: 3    guaiFENesin (MUCINEX) 600 MG 12 hr tablet Take 1 tablet (600 mg total) by mouth 2 (two) times daily. Qty: 20 tablet, Refills: 2    lisinopril (PRINIVIL,ZESTRIL) 20 MG tablet Take 1 tablet (20 mg total) by mouth daily. Qty: 30 tablet, Refills: 11    orphenadrine (NORFLEX) 100 MG tablet Take 1 tablet (100 mg total) by mouth 2 (two) times daily. Qty: 20 tablet, Refills: 3    predniSONE (STERAPRED UNI-PAK 21 TAB) 10 MG (21) TBPK tablet Please take 6 pills in the morning on the day 1, then taper by 1 pill daily until finished, thank you Qty: 21 tablet, Refills: 0      CONTINUE these medications which have NOT CHANGED   Details  atorvastatin (LIPITOR) 80 MG tablet Take 80 mg by mouth daily.    busPIRone (BUSPAR) 10 MG tablet Take 20 mg  by mouth 3 (three) times daily.    diazepam (VALIUM) 5 MG tablet Take 7.5 mg by mouth 3 (three) times daily. for anxiety Refills: 1    docusate sodium (STOOL SOFTENER) 100 MG capsule Take 100 mg by mouth 2 (two) times daily.    esomeprazole (NEXIUM) 40 MG capsule Take 40 mg by mouth daily at 12 noon.    ferrous sulfate 325 (65 FE) MG tablet Take 325 mg by mouth daily with breakfast.    fluticasone (FLONASE) 50 MCG/ACT nasal spray Place 1 spray into both nostrils daily. Qty: 16 g, Refills: 0    folic acid (FOLVITE) 1 MG tablet Take 1 mg by mouth 2 (two) times daily.     furosemide (LASIX) 20 MG tablet Take 20-40 mg by mouth See admin instructions. Take 2 tablets (40 mg) by mouth every morning and Take 20 mg by mouth in the evening.    metFORMIN (GLUCOPHAGE) 1000 MG tablet Take 1,000 mg by mouth 2 (two) times daily with a meal.    methotrexate 50 MG/2ML injection Inject 0.8 mLs into the skin once a week. Use on Wednesday.    metoprolol tartrate (LOPRESSOR) 25 MG tablet Take 25 mg by mouth 2 (two) times daily.    morphine (MS CONTIN) 15 MG 12 hr tablet Take 15 mg by mouth every 12 (twelve) hours.  nicotine (NICODERM CQ - DOSED IN MG/24 HOURS) 21 mg/24hr patch Place 21 mg onto the skin daily.    pregabalin (LYRICA) 150 MG capsule Take 150 mg by mouth 3 (three) times daily.    PROAIR HFA 108 (90 Base) MCG/ACT inhaler INHALE 2 PUFFS PO Q 6 TO 8 H PRN FOR WHEEZING Refills: 10    promethazine (PHENERGAN) 25 MG tablet Take 25 mg by mouth every 6 (six) hours as needed for nausea or vomiting.    tiotropium (SPIRIVA) 18 MCG inhalation capsule Place 1 capsule (18 mcg total) into inhaler and inhale daily. Qty: 30 capsule, Refills: 12    tiZANidine (ZANAFLEX) 4 MG tablet Take 4 mg by mouth 4 (four) times daily -  with meals and at bedtime.     warfarin (COUMADIN) 10 MG tablet Take 5-10 mg by mouth See admin instructions. Take5 mg tablet by mouth every evening on Monday, Wednesday,  Saturday. Take 10 mg tablet by mouth on Tuesday, Thursday, Friday and Sunday      STOP taking these medications     omeprazole (PRILOSEC) 40 MG capsule          DISCHARGE INSTRUCTIONS:    The patient is to follow-up with primary care physician within one week after discharge  If you experience worsening of your admission symptoms, develop shortness of breath, life threatening emergency, suicidal or homicidal thoughts you must seek medical attention immediately by calling 911 or calling your MD immediately  if symptoms less severe.  You Must read complete instructions/literature along with all the possible adverse reactions/side effects for all the Medicines you take and that have been prescribed to you. Take any new Medicines after you have completely understood and accept all the possible adverse reactions/side effects.   Please note  You were cared for by a hospitalist during your hospital stay. If you have any questions about your discharge medications or the care you received while you were in the hospital after you are discharged, you can call the unit and asked to speak with the hospitalist on call if the hospitalist that took care of you is not available. Once you are discharged, your primary care physician will handle any further medical issues. Please note that NO REFILLS for any discharge medications will be authorized once you are discharged, as it is imperative that you return to your primary care physician (or establish a relationship with a primary care physician if you do not have one) for your aftercare needs so that they can reassess your need for medications and monitor your lab values.    Today   CHIEF COMPLAINT:   Chief Complaint  Patient presents with  . Cough  . Weakness    HISTORY OF PRESENT ILLNESS:      VITAL SIGNS:  Blood pressure (!) 174/78, pulse 69, temperature 97.8 F (36.6 C), temperature source Oral, resp. rate 16, height 5\' 5" (1.651 m),  weight 93.9 kg (207 lb 0.2 oz), SpO2 96 %.  I/O:    Intake/Output Summary (Last 24 hours) at 09/19/16 1722 Last data filed at 09/19/16 1318  Gross per 24 hour  Intake              820 ml  Output                0 ml  Net              82 0 ml    PHYSICAL EXAMINATION:  GENERAL:  61 y.o.-year-old patient lying  in the bed with no acute distress.  EYES: Pupils equal, round, reactive to light and accommodation. No scleral icterus. Extraocular muscles intact.  HEENT: Head atraumatic, normocephalic. Oropharynx and nasopharynx clear.  NECK:  Supple, no jugular venous distention. No thyroid enlargement, no tenderness.  LUNGS: Normal breath sounds bilaterally, no wheezing, rales,rhonchi or crepitation. No use of accessory muscles of respiration.  CARDIOVASCULAR: S1, S2 normal. No murmurs, rubs, or gallops.  ABDOMEN: Soft, non-tender, non-distended. Bowel sounds present. No organomegaly or mass.  EXTREMITIES: No pedal edema, cyanosis, or clubbing.  NEUROLOGIC: Cranial nerves II through XII are intact. Muscle strength 5/5 in all extremities. Sensation intact. Gait not checked.  PSYCHIATRIC: The patient is alert and oriented x 3.  SKIN: No obvious rash, lesion, or ulcer.   DATA REVIEW:   CBC  Recent Labs Lab 09/18/16 0325  WBC 7.5  HGB 9.0*  HCT 27.6*  PLT 148*    Chemistries   Recent Labs Lab 09/16/16 1700  09/17/16 1126  NA  --   < > 138  K 3.4*  < > 3.5  CL  --   < > 111  CO2  --   < > 23  GLUCOSE  --   < > 207*  BUN  --   < > 11  CREATININE  --   < > 0.46  CALCIUM  --   < > 8.3*  MG 2.1  --   --   AST  --   < > 45*  ALT  --   < > 22  ALKPHOS  --   < > 77  BILITOT  --   < > 0.5  < > = values in this interval not displayed.  Cardiac Enzymes  Recent Labs Lab 09/16/16 1607  TROPONINI 0.15*    Microbiology Results  Results for orders placed or performed during the hospital encounter of 09/16/16  Blood Culture (routine x 2)     Status: None (Preliminary result)    Collection Time: 09/16/16  4:45 AM  Result Value Ref Range Status   Specimen Description BLOOD LT HAND  Final   Special Requests   Final    BOTTLES DRAWN AEROBIC AND ANAEROBIC Blood Culture adequate volume   Culture NO GROWTH 3 DAYS  Final   Report Status PENDING  Incomplete  Blood Culture (routine x 2)     Status: None (Preliminary result)   Collection Time: 09/16/16  4:45 AM  Result Value Ref Range Status   Specimen Description BLOOD LT FOREARM  Final   Special Requests   Final    BOTTLES DRAWN AEROBIC AND ANAEROBIC Blood Culture adequate volume   Culture NO GROWTH 3 DAYS  Final   Report Status PENDING  Incomplete  Urine Culture     Status: Abnormal   Collection Time: 09/16/16  4:45 AM  Result Value Ref Range Status   Specimen Description URINE, RANDOM  Final   Special Requests NONE  Final   Culture >=100,000 COLONIES/mL KLEBSIELLA PNEUMONIAE (A)  Final   Report Status 09/18/2016 FINAL  Final   Organism ID, Bacteria KLEBSIELLA PNEUMONIAE (A)  Final      Susceptibility   Klebsiella pneumoniae - MIC*    AMPICILLIN >=32 RESISTANT Resistant     CEFAZOLIN <=4 SENSITIVE Sensitive     CEFTRIAXONE <=1 SENSITIVE Sensitive     CIPROFLOXACIN <=0.25 SENSITIVE Sensitive     GENTAMICIN <=1 SENSITIVE Sensitive     IMIPENEM <=0.25 SENSITIVE Sensitive     NITROFURANTOIN <=  16 SENSITIVE Sensitive     TRIMETH/SULFA <=20 SENSITIVE Sensitive     AMPICILLIN/SULBACTAM 16 INTERMEDIATE Intermediate     PIP/TAZO <=4 SENSITIVE Sensitive     Extended ESBL NEGATIVE Sensitive     * >=100,000 COLONIES/mL KLEBSIELLA PNEUMONIAE  MRSA PCR Screening     Status: None   Collection Time: 09/16/16  7:06 AM  Result Value Ref Range Status   MRSA by PCR NEGATIVE NEGATIVE Final    Comment:        The GeneXpert MRSA Assay (FDA approved for NASAL specimens only), is one component of a comprehensive MRSA colonization surveillance program. It is not intended to diagnose MRSA infection nor to guide or monitor  treatment for MRSA infections.     RADIOLOGY:  No results found.  EKG:   Orders placed or performed during the hospital encounter of 09/16/16  . ED EKG 12-Lead  . ED EKG 12-Lead  . EKG 12-Lead  . EKG 12-Lead      Management plans discussed with the patient, family and they are in agreement.  CODE STATUS:     Code Status Orders        Start     Ordered   09/16/16 0648  Full code  Continuous     09/16/16 0647    Code Status History    Date Active Date Inactive Code Status Order ID Comments User Context   01/05/2016  7:58 PM 01/07/2016  2:51 PM Full Code [id number]  Auburn Bilberry, MD Inpatient      TOTAL TIME TAKING CARE OF THIS PATIENT: 40 minutes.    Katharina Caper M.D on 09/19/2016 at 5:22 PM  Between 7am to 6pm - Pager - [phone]  After 6pm go to [email or web address] - password [email or web address] [facility]  [place] [facility]  Office  [phone]  CC: Primary care physician; Theresa Duty, MD

## 2016-09-19 NOTE — Progress Notes (Signed)
Pt asking if she should take Lovenox at home this evening, concerned that INR is 1.30. MD Vaickute notifed. Orders recieved for Coumadin 10 mg and Lovenox (pharmacist will dose), one time now.

## 2016-09-19 NOTE — Progress Notes (Signed)
DISCHARGE NOTE:  Pt given discharge information. Pt verbalized understanding. Pt wheeled to car by staff

## 2016-09-19 NOTE — Progress Notes (Signed)
Pts manual BP after 10 mg of Lisinopril (1424) is 160/70. MD Vaickute notified. Orders received to give another 10 mg Lisinopril now. Order placed.

## 2016-09-19 NOTE — Progress Notes (Signed)
ANTICOAGULATION CONSULT NOTE  Pharmacy Consult for warfarin Indication: h/o DVT/PE  No Known Allergies  Patient Measurements: Height: 5\' 5"  (165.1 cm) Weight: 207 lb 0.2 oz (93.9 kg) IBW/kg (Calculated) : 57   Vital Signs: Temp: 97.8 F (36.6 C) (06/25 0734) Temp Source: Oral (06/25 0734) BP: 172/66 (06/25 0734) Pulse Rate: 65 (06/25 0734)  Labs:  Recent Labs  09/16/16 1607 09/17/16 0523 09/17/16 1126 09/18/16 0325 09/19/16 0321  HGB  --  8.6*  --  9.0*  --   HCT  --  26.7*  --  27.6*  --   PLT  --  143*  --  148*  --   LABPROT  --  28.0*  --   --  16.3*  INR  --  2.56  --   --  1.30  CREATININE  --  0.45 0.46  --   --   TROPONINI 0.15*  --   --   --   --     Estimated Creatinine Clearance: 83.7 mL/min (by C-G formula based on SCr of 0.46 mg/dL).   Medical History: Past Medical History:  Diagnosis Date  . Anemia   . Anxiety   . Arthritis   . DDD (degenerative disc disease), cervical   . Depression   . Diabetes mellitus without complication (HCC)   . DVT (deep venous thrombosis) (HCC)   . GERD (gastroesophageal reflux disease)   . Headache   . History of pulmonary embolus (PE) 2005  . Hx of antiphospholipid antibody syndrome   . Hypertension   . Hypotension   . Pancreatitis [address] [place]  . Shortness of breath dyspnea   . Sleep apnea   . Vertigo    Assessment: 61 y/o F with a h/o DVT/PE on coumadin 5 mg Mon,Wed,Sat and 10 mg Tues, Thur, Fri, Sun. Patient has antiphospholipid antibody syndrome  6/23 INR   2.56 6/24  No INR        Warfarin 10 mg 6/25  INR  1.30  Goal of Therapy:  INR 2-3  Plan:  Will continue PTA dosing of warfarin as above and f/u AM INR.  (patient on Cefepime)  Angelyna Henderson A 09/19/2016,8:01 AM

## 2016-09-19 NOTE — Progress Notes (Signed)
Pharmacy Antibiotic Note  Cathy Fox is a 61 y.o. female admitted on 09/16/2016 with pneumonia.  Pharmacy has been consulted for Cefepime dosing.  Vancomycin discontinued 6/23- MRSA PCR negative Also with UTI  Plan: Day 4- Will continue cefepime 2g IV q8h    Height: 5\' 5"  (165.1 cm) Weight: 207 lb 0.2 oz (93.9 kg) IBW/kg (Calculated) : 57  Temp (24hrs), Avg:98.2 F (36.8 C), Min:97.8 F (36.6 C), Max:98.6 F (37 C)   Recent Labs Lab 09/16/16 0445 09/16/16 0746 09/17/16 0523 09/17/16 1126 09/18/16 0325  WBC 14.4*  --  6.4  --  7.5  CREATININE 0.98  --  0.45 0.46  --   LATICACIDVEN 1.7 1.3  --   --   --   VANCOTROUGH  --   --   --  10*  --     Estimated Creatinine Clearance: 83.7 mL/min (by C-G formula based on SCr of 0.46 mg/dL).    No Known Allergies   Thank you for allowing pharmacy to be a part of this patient's care.  09/20/16 PharmD Clinical Pharmacist 09/19/2016

## 2016-09-21 LAB — CULTURE, BLOOD (ROUTINE X 2)
Culture: NO GROWTH
Culture: NO GROWTH
SPECIAL REQUESTS: ADEQUATE
Special Requests: ADEQUATE

## 2016-09-27 NOTE — Care Management (Signed)
Post discharge: Telephone message transferred to this RNCM from patient stating that she "wished she had taken home O2 when it was offered" and "believes she needs more antibiotics for pneumonia and UTI".  Patient left a lengthy voicemail without difficulty speaking in full sentences or audible wheezing on the voice message. Patient was set up with [facility] home health on last discharge by [facility]. I have forwarded voicemail to Misty Stanley to contact patient. I have notified Christy with [facility] of patient's call. Neysa Bonito will follow up with patient and [facility].  Per Neysa Bonito PT saw patient yesterday and RN to see on Thursday however RN had already opened case in patient's home and visited with patient.

## 2016-10-13 DIAGNOSIS — E119 Type 2 diabetes mellitus without complications: Secondary | ICD-10-CM

## 2016-10-13 DIAGNOSIS — Z794 Long term (current) use of insulin: Secondary | ICD-10-CM

## 2017-05-23 ENCOUNTER — Emergency Department: Payer: Medicare Other

## 2017-05-23 ENCOUNTER — Emergency Department
Admission: EM | Admit: 2017-05-23 | Discharge: 2017-05-23 | Disposition: A | Discharge status: Home / Self Care | Payer: Medicare Other | Attending: Emergency Medicine | Admitting: Emergency Medicine

## 2017-05-23 ENCOUNTER — Other Ambulatory Visit: Payer: Self-pay

## 2017-05-23 DIAGNOSIS — M542 Cervicalgia: Secondary | ICD-10-CM | POA: Diagnosis not present

## 2017-05-23 DIAGNOSIS — M79602 Pain in left arm: Secondary | ICD-10-CM | POA: Insufficient documentation

## 2017-05-23 DIAGNOSIS — M25552 Pain in left hip: Secondary | ICD-10-CM | POA: Diagnosis not present

## 2017-05-23 DIAGNOSIS — W19XXXA Unspecified fall, initial encounter: Secondary | ICD-10-CM

## 2017-05-23 DIAGNOSIS — W108XXA Fall (on) (from) other stairs and steps, initial encounter: Secondary | ICD-10-CM | POA: Diagnosis not present

## 2017-05-23 LAB — BASIC METABOLIC PANEL
ANION GAP: 9 (ref 5–15)
BUN: 7 mg/dL (ref 6–20)
CALCIUM: 8.3 mg/dL — AB (ref 8.9–10.3)
CO2: 23 mmol/L (ref 22–32)
CREATININE: 0.52 mg/dL (ref 0.44–1.00)
Chloride: 106 mmol/L (ref 101–111)
Glucose, Bld: 103 mg/dL — ABNORMAL HIGH (ref 65–99)
Potassium: 3.8 mmol/L (ref 3.5–5.1)
SODIUM: 138 mmol/L (ref 135–145)

## 2017-05-23 LAB — CBC WITH DIFFERENTIAL/PLATELET
BASOS ABS: 0.1 10*3/uL (ref 0–0.1)
BASOS PCT: 1 %
EOS ABS: 0.1 10*3/uL (ref 0–0.7)
EOS PCT: 2 %
HCT: 32.7 % — ABNORMAL LOW (ref 35.0–47.0)
Hemoglobin: 10.5 g/dL — ABNORMAL LOW (ref 12.0–16.0)
Lymphocytes Relative: 33 %
Lymphs Abs: 2 10*3/uL (ref 1.0–3.6)
MCH: 25.6 pg — ABNORMAL LOW (ref 26.0–34.0)
MCHC: 32.1 g/dL (ref 32.0–36.0)
MCV: 79.7 fL — ABNORMAL LOW (ref 80.0–100.0)
MONO ABS: 0.5 10*3/uL (ref 0.2–0.9)
MONOS PCT: 9 %
Neutro Abs: 3.3 10*3/uL (ref 1.4–6.5)
Neutrophils Relative %: 55 %
PLATELETS: 227 10*3/uL (ref 150–440)
RBC: 4.11 MIL/uL (ref 3.80–5.20)
RDW: 22.1 % — AB (ref 11.5–14.5)
WBC: 6 10*3/uL (ref 3.6–11.0)

## 2017-05-23 LAB — PROTIME-INR
INR: 3.53
PROTHROMBIN TIME: 35.1 s — AB (ref 11.4–15.2)

## 2017-05-23 MED ORDER — OXYCODONE-ACETAMINOPHEN 5-325 MG PO TABS
1.0000 | ORAL_TABLET | Freq: Once | ORAL | Status: AC
  Administered 2017-05-23: 1 via ORAL
  Filled 2017-05-23: qty 1

## 2017-05-23 NOTE — ED Triage Notes (Signed)
Pt arrived via EMS from home d/t fall. Pt reports she was trying to walk down some stairs and missed the last step, she then fell onto her left side. Pt recently broke her left humerus in November and is currently going to physical therapy. Pt is on blood thinners and A&O x4 at this time.

## 2017-05-23 NOTE — ED Provider Notes (Addendum)
[facility] Emergency Department Provider Note  ____________________________________________   I have reviewed the triage vital signs and the nursing notes. Where available I have reviewed prior notes and, if possible and indicated, outside hospital notes.    HISTORY  Chief Complaint Fall    HPI Cathy Fox is a 62 y.o. female who presents today complaining of pain to her the entire left side of her body after non-syncopal fall at home.  She states that she missed the last step of the stairs and fell.  Non-syncopal event.  Patient is on Coumadin with a normal INR today.  History of PE in the past.  She actually had no symptoms but just was looking at her friends instead of the stairs and missed the very last step and fell.  She did not hit her head she thinks but she does believe that she has neck pain, left shoulder pain, left arm pain, complicated by the fact that she had a recent left humerus fracture, left elbow pain, and left forearm pain.  She also has left hip pain.  Fortunately, she does not have any chest pain or abdominal pain or numbness or weakness or knee pain or ankle pain on any side.  The patient states that she has been on hydrocodone for her pain after her fall, but it makes her nauseated and so she went today and was given a prescription for Percocet but not yet filled it.  She would like some pain medications here from her fall.  Past Medical History:  Diagnosis Date  . Anemia   . Anxiety   . Arthritis   . DDD (degenerative disc disease), cervical   . Depression   . Diabetes mellitus without complication (HCC)   . DVT (deep venous thrombosis) (HCC)   . GERD (gastroesophageal reflux disease)   . Headache   . History of pulmonary embolus (PE) 2005  . Hx of antiphospholipid antibody syndrome   . Hypertension   . Hypotension   . Pancreatitis [address] [place]  . Shortness of breath dyspnea   . Sleep apnea   . Vertigo     Patient  Active Problem List   Diagnosis Date Noted  . Hyponatremia 09/19/2016  . Hypokalemia 09/19/2016  . Leukocytosis 09/19/2016  . Elevated troponin 09/19/2016  . Klebsiella cystitis 09/19/2016  . Generalized weakness 09/19/2016  . Tobacco abuse counseling 09/19/2016  . Migraine headache 09/19/2016  . Acute respiratory failure (HCC) 09/16/2016  . Pneumonia 01/05/2016    Past Surgical History:  Procedure Laterality Date  . ABDOMINAL HYSTERECTOMY    . DILATION AND CURETTAGE OF UTERUS    . JOINT REPLACEMENT Left    Total knee Replacement  . NASAL SEPTOPLASTY W/ TURBINOPLASTY Bilateral 12/16/2014   Procedure: NASAL SEPTOPLASTY WITH BILATERAL TURBINATE REDUCTION;  Surgeon: Linus Salmons, MD;  Location: [facility] ORS;  Service: ENT;  Laterality: Bilateral;    Prior to Admission medications   Medication Sig Start Date End Date Taking? Authorizing Provider  albuterol (PROVENTIL) (2.5 MG/3ML) 0.083% nebulizer solution Take 3 mLs (2.5 mg total) by nebulization every 6 (six) hours as needed for wheezing or shortness of breath. 09/19/16   Katharina Caper, MD  atorvastatin (LIPITOR) 80 MG tablet Take 80 mg by mouth daily.    [provider]  azithromycin (ZITHROMAX Z-PAK) 250 MG tablet Take as directed 09/19/16   Katharina Caper, MD  budesonide-formoterol [facility]) 160-4.5 MCG/ACT inhaler Inhale 2 puffs into the lungs 2 (two) times daily.  09/19/16   Katharina Caper, MD  busPIRone (BUSPAR) 10 MG tablet Take 20 mg by mouth 3 (three) times daily.    [provider]  cefdinir (OMNICEF) 300 MG capsule Take 1 capsule (300 mg total) by mouth 2 (two) times daily. 09/19/16   Katharina Caper, MD  dextromethorphan (DELSYM) 30 MG/5ML liquid Take 5 mLs (30 mg total) by mouth 2 (two) times daily. 09/19/16   Katharina Caper, MD  diazepam (VALIUM) 5 MG tablet Take 7.5 mg by mouth 3 (three) times daily. for anxiety 04/03/15   [provider]  docusate sodium (STOOL SOFTENER) 100 MG capsule Take 100 mg by  mouth 2 (two) times daily.    [provider]  esomeprazole (NEXIUM) 40 MG capsule Take 40 mg by mouth daily at 12 noon.    [provider]  ferrous sulfate 325 (65 FE) MG tablet Take 325 mg by mouth daily with breakfast.    [provider]  FLUoxetine (PROZAC) 20 MG capsule Take 3 capsules (60 mg total) by mouth daily. 09/19/16   Katharina Caper, MD  fluticasone (FLONASE) 50 MCG/ACT nasal spray Place 1 spray into both nostrils daily. 01/08/16   Hower, Cletis [place], MD  folic acid (FOLVITE) 1 MG tablet Take 1 mg by mouth 2 (two) times daily.     [provider]  furosemide (LASIX) 20 MG tablet Take 20-40 mg by mouth See admin instructions. Take 2 tablets (40 mg) by mouth every morning and Take 20 mg by mouth in the evening.    [provider]  guaiFENesin (MUCINEX) 600 MG 12 hr tablet Take 1 tablet (600 mg total) by mouth 2 (two) times daily. 09/19/16   Katharina Caper, MD  lisinopril (PRINIVIL,ZESTRIL) 20 MG tablet Take 1 tablet (20 mg total) by mouth daily. 09/19/16 09/19/17  Katharina Caper, MD  metFORMIN (GLUCOPHAGE) 1000 MG tablet Take 1,000 mg by mouth 2 (two) times daily with a meal.    [provider]  methotrexate 50 MG/2ML injection Inject 0.8 mLs into the skin once a week. Use on Wednesday. 08/13/15   [provider]  metoprolol tartrate (LOPRESSOR) 25 MG tablet Take 25 mg by mouth 2 (two) times daily.    [provider]  morphine (MS CONTIN) 15 MG 12 hr tablet Take 15 mg by mouth every 12 (twelve) hours.    [provider]  nicotine (NICODERM CQ - DOSED IN MG/24 HOURS) 21 mg/24hr patch Place 21 mg onto the skin daily.    [provider]  orphenadrine (NORFLEX) 100 MG tablet Take 1 tablet (100 mg total) by mouth 2 (two) times daily. 09/19/16   Katharina Caper, MD  predniSONE (STERAPRED UNI-PAK 21 TAB) 10 MG (21) TBPK tablet Please take 6 pills in the morning on the day 1, then taper by 1 pill daily until finished,  thank you 09/19/16   Katharina Caper, MD  pregabalin (LYRICA) 150 MG capsule Take 150 mg by mouth 3 (three) times daily.    [provider]  PROAIR HFA 108 (90 Base) MCG/ACT inhaler INHALE 2 PUFFS PO Q 6 TO 8 H PRN FOR WHEEZING 03/19/15   [provider]  promethazine (PHENERGAN) 25 MG tablet Take 25 mg by mouth every 6 (six) hours as needed for nausea or vomiting.    [provider]  tiotropium (SPIRIVA) 18 MCG inhalation capsule Place 1 capsule (18 mcg total) into inhaler and inhale daily. 01/07/16   Hower, Cletis [place], MD  tiZANidine (ZANAFLEX) 4 MG  tablet Take 4 mg by mouth 4 (four) times daily -  with meals and at bedtime.     [provider]  warfarin (COUMADIN) 10 MG tablet Take 5-10 mg by mouth See admin instructions. Take5 mg tablet by mouth every evening on Monday, Wednesday, Saturday. Take 10 mg tablet by mouth on Tuesday, Thursday, Friday and Sunday    [provider]    Allergies Patient has no known allergies.  Family History  Problem Relation Age of Onset  . Heart disease Mother   . Heart disease Father   . Kidney cancer Father   . Colon cancer Father     Social History Social History   Tobacco Use  . Smoking status: Current Every Day Smoker    Packs/day: 1.00    Types: Cigarettes  . Smokeless tobacco: Never Used  Substance Use Topics  . Alcohol use: No  . Drug use: No    Review of Systems Constitutional: No fever/chills Eyes: No visual changes. ENT: No sore throat. No stiff neck no neck pain Cardiovascular: Denies chest pain. Respiratory: Denies shortness of breath. Gastrointestinal:   no vomiting.  No diarrhea.  No constipation. Genitourinary: Negative for dysuria. Musculoskeletal: Negative lower extremity swelling Skin: Negative for rash. Neurological: Negative for severe headaches, focal weakness or numbness.   ____________________________________________   PHYSICAL EXAM:  VITAL SIGNS: ED Triage Vitals  Enc  Vitals Group     BP 05/23/17 1847 111/64     Pulse Rate 05/23/17 1847 (!) 56     Resp 05/23/17 1847 13     Temp 05/23/17 1847 98 F (36.7 C)     Temp Source 05/23/17 1847 Oral     SpO2 05/23/17 1847 98 %     Weight 05/23/17 1846 190 lb (86.2 kg)     Height 05/23/17 1846 5\' 5"  (1.651 m)     Head Circumference --      Peak Flow --      Pain Score 05/23/17 1846 8     Pain Loc --      Pain Edu? --      Excl. in GC? --     Constitutional: Alert and oriented. Well appearing and in no acute distress.  Anxious and upset but nontoxic in appearance Eyes: Conjunctivae are normal Head: Atraumatic HEENT: No congestion/rhinnorhea. Mucous membranes are moist.  Oropharynx non-erythematous Neck:   Spinal tenderness in the trapezius muscle on the left side noted, no midline tenderness no meningismus, no masses, no stridor Cardiovascular: Normal rate, regular rhythm. Grossly normal heart sounds.  Good peripheral circulation. Respiratory: Normal respiratory effort.  No retractions. Lungs CTAB. Abdominal: Soft and nontender. No distention. No guarding no rebound Back:  There is no focal tenderness or step off.  there is no midline tenderness there are no lesions noted. there is no CVA tenderness Musculoskeletal: There is full range of motion of the lower extremity on the left there is some mild tenderness to palpation of the left hip with no significant bruising noted.  There is tenderness to palpation up and down the arm mostly in the proximal humerus.  No obvious deformity strong distal pulses compartments are soft. Neurologic:  Normal speech and language. No gross focal neurologic deficits are appreciated.  Skin:  Skin is warm, dry and intact. No rash noted. Psychiatric: Mood and affect are normal. Speech and behavior are normal.  ____________________________________________   LABS (all labs ordered are listed, but only abnormal results are displayed)  Labs Reviewed  [facility]  CBC WITH  DIFFERENTIAL/PLATELET  BASIC METABOLIC PANEL    Pertinent labs  results that were available during my care of the patient were reviewed by me and considered in my medical decision making (see chart for details). ____________________________________________  EKG  I personally interpreted any EKGs ordered by me or triage  ____________________________________________  RADIOLOGY  Pertinent labs & imaging results that were available during my care of the patient were reviewed by me and considered in my medical decision making (see chart for details). If possible, patient and/or family made aware of any abnormal findings.  No results found. ____________________________________________    PROCEDURES  Procedure(s) performed: None  Procedures  Critical Care performed: None  ____________________________________________   INITIAL IMPRESSION / ASSESSMENT AND PLAN / ED COURSE  Pertinent labs & imaging results that were available during my care of the patient were reviewed by me and considered in my medical decision making (see chart for details).  Patient with a non-syncopal fall, has a known left closed fracture of the proximal humerus, x-ray to my read does not show any great significant difference and certainly no change in management needed, no compartment syndrome or vascular at the deficiency noted.  Patient does have a comminuted proximal humerus fracture involving the surgical neck and greater tuberosity on prior x-ray.  Given that she is on Coumadin we did check an INR which is pending.  CT of the head and neck however negative and given her low suspicion I am not surprised.  This was a non-syncopal fall I do not think further workup is indicated.  We have given her pain medication is my hope that we can get her safely home.  I have personally reviewed her x-ray, as well as x-ray reads from outpatient hospital, I cannot see the outpatient films however.  This therefore is a  comparative likelihoods.    ____________________________________________   FINAL CLINICAL IMPRESSION(S) / ED DIAGNOSES  Final diagnoses:  None      This chart was dictated using voice recognition software.  Despite best efforts to proofread,  errors can occur which can change meaning.      Jeanmarie Plant, MD 05/23/17 [place]    Jeanmarie Plant, MD 05/23/17 [phone]

## 2017-05-23 NOTE — ED Notes (Signed)
Family at bedside. Informed pt is in imaging.

## 2017-05-23 NOTE — ED Notes (Signed)
Pt in imaging at this time.

## 2017-05-23 NOTE — ED Notes (Signed)
ED Provider at bedside. 

## 2017-05-23 NOTE — ED Notes (Signed)
Pt. Verbalizes understanding of d/c instructions, medications, and follow-up. VS stable.  Pt. In NAD at time of d/c and denies further concerns regarding this visit. Pt. Stable at the time of departure from the unit, departing unit by the safest and most appropriate manner per that pt condition and limitations with all belongings accounted for. Pt advised to return to the ED at any time for emergent concerns, or for new/worsening symptoms.   

## 2017-05-23 NOTE — Discharge Instructions (Signed)
Your Coumadin level is 3.5 which is somewhat high.  He may consider not taking it tonight call your doctor first thing in the morning.  Take your pain medication which you already have at home for ongoing pain.  Return to the emergency room for any new or worrisome symptoms.  Be very careful on stairs.

## 2018-10-23 ENCOUNTER — Other Ambulatory Visit: Payer: Self-pay | Admitting: *Deleted

## 2018-10-23 DIAGNOSIS — R079 Chest pain, unspecified: Secondary | ICD-10-CM

## 2018-10-30 ENCOUNTER — Telehealth (HOSPITAL_COMMUNITY): Payer: Self-pay | Admitting: Emergency Medicine

## 2018-10-30 NOTE — Telephone Encounter (Signed)
Reaching out to patient to offer assistance regarding upcoming cardiac imaging study; pt verbalizes understanding of appt date/time, parking situation and where to check in, pre-test NPO status and medications ordered, and verified current allergies; name and call back number provided for further questions should they arise Eilyn Polack RN Navigator Cardiac Imaging [facility] [phone] office [phone] cell 

## 2018-10-31 ENCOUNTER — Ambulatory Visit
Admission: RE | Admit: 2018-10-31 | Discharge: 2018-10-31 | Disposition: A | Discharge status: Home / Self Care | Payer: Medicare Other | Source: Ambulatory Visit | Attending: Cardiology | Admitting: Cardiology

## 2018-10-31 ENCOUNTER — Other Ambulatory Visit: Payer: Self-pay

## 2018-10-31 DIAGNOSIS — Z7901 Long term (current) use of anticoagulants: Secondary | ICD-10-CM | POA: Insufficient documentation

## 2018-10-31 DIAGNOSIS — Z79899 Other long term (current) drug therapy: Secondary | ICD-10-CM | POA: Insufficient documentation

## 2018-10-31 DIAGNOSIS — E119 Type 2 diabetes mellitus without complications: Secondary | ICD-10-CM | POA: Diagnosis not present

## 2018-10-31 DIAGNOSIS — Z794 Long term (current) use of insulin: Secondary | ICD-10-CM | POA: Diagnosis not present

## 2018-10-31 DIAGNOSIS — Z86711 Personal history of pulmonary embolism: Secondary | ICD-10-CM | POA: Insufficient documentation

## 2018-10-31 DIAGNOSIS — Z86718 Personal history of other venous thrombosis and embolism: Secondary | ICD-10-CM | POA: Diagnosis not present

## 2018-10-31 DIAGNOSIS — R079 Chest pain, unspecified: Secondary | ICD-10-CM

## 2018-10-31 DIAGNOSIS — I10 Essential (primary) hypertension: Secondary | ICD-10-CM | POA: Insufficient documentation

## 2018-10-31 MED ORDER — IOHEXOL 350 MG/ML SOLN
100.0000 mL | Freq: Once | INTRAVENOUS | Status: AC | PRN
  Administered 2018-10-31: 100 mL via INTRAVENOUS

## 2018-10-31 MED ORDER — SODIUM CHLORIDE 0.9 % IV BOLUS
250.0000 mL | Freq: Once | INTRAVENOUS | Status: AC
  Administered 2018-10-31: 250 mL via INTRAVENOUS

## 2018-10-31 MED ORDER — NITROGLYCERIN 0.4 MG SL SUBL
0.8000 mg | SUBLINGUAL_TABLET | Freq: Once | SUBLINGUAL | Status: AC
  Administered 2018-10-31: 0.8 mg via SUBLINGUAL

## 2018-10-31 NOTE — Progress Notes (Signed)
Patient ID: MADDI COLLAR, female   DOB: 03-Mar-1956, 63 y.o.   MRN: [id number]   Pt denies dizziness, lightheadedness; VS stable; pt given cup of coffee; ambulatory with steady gait noted

## 2018-10-31 NOTE — Progress Notes (Signed)
Patient states continues to feel fine and had no complaints. Cardiologist notified ordered to proceed and continue to monitor patient.

## 2018-10-31 NOTE — Progress Notes (Signed)
Patient arrived for CT Took multiple blood pressure and is low states history of low blood pressure. Notified cardiology. Patient has no complaints.

## 2019-06-13 ENCOUNTER — Ambulatory Visit: Payer: Medicare Other | Attending: Internal Medicine

## 2020-01-14 ENCOUNTER — Ambulatory Visit: Payer: Medicare Other

## 2020-12-21 ENCOUNTER — Emergency Department: Payer: Medicare Other

## 2020-12-21 ENCOUNTER — Other Ambulatory Visit: Payer: Self-pay

## 2020-12-21 ENCOUNTER — Inpatient Hospital Stay
Admission: EM | Admit: 2020-12-21 | Discharge: 2020-12-23 | DRG: 193 | Disposition: A | Discharge status: Home Health | Payer: Medicare Other | Attending: Internal Medicine | Admitting: Internal Medicine

## 2020-12-21 ENCOUNTER — Encounter: Payer: Self-pay | Admitting: Emergency Medicine

## 2020-12-21 DIAGNOSIS — J189 Pneumonia, unspecified organism: Principal | ICD-10-CM | POA: Diagnosis present

## 2020-12-21 DIAGNOSIS — E041 Nontoxic single thyroid nodule: Secondary | ICD-10-CM | POA: Diagnosis present

## 2020-12-21 DIAGNOSIS — E871 Hypo-osmolality and hyponatremia: Secondary | ICD-10-CM | POA: Diagnosis present

## 2020-12-21 DIAGNOSIS — G4733 Obstructive sleep apnea (adult) (pediatric): Secondary | ICD-10-CM | POA: Diagnosis present

## 2020-12-21 DIAGNOSIS — Z79899 Other long term (current) drug therapy: Secondary | ICD-10-CM

## 2020-12-21 DIAGNOSIS — F32A Depression, unspecified: Secondary | ICD-10-CM | POA: Diagnosis present

## 2020-12-21 DIAGNOSIS — M069 Rheumatoid arthritis, unspecified: Secondary | ICD-10-CM | POA: Diagnosis present

## 2020-12-21 DIAGNOSIS — Z96652 Presence of left artificial knee joint: Secondary | ICD-10-CM | POA: Diagnosis present

## 2020-12-21 DIAGNOSIS — Z716 Tobacco abuse counseling: Secondary | ICD-10-CM

## 2020-12-21 DIAGNOSIS — Z20822 Contact with and (suspected) exposure to covid-19: Secondary | ICD-10-CM | POA: Diagnosis present

## 2020-12-21 DIAGNOSIS — R531 Weakness: Secondary | ICD-10-CM | POA: Diagnosis not present

## 2020-12-21 DIAGNOSIS — D6861 Antiphospholipid syndrome: Secondary | ICD-10-CM | POA: Diagnosis present

## 2020-12-21 DIAGNOSIS — J441 Chronic obstructive pulmonary disease with (acute) exacerbation: Secondary | ICD-10-CM

## 2020-12-21 DIAGNOSIS — Z794 Long term (current) use of insulin: Secondary | ICD-10-CM

## 2020-12-21 DIAGNOSIS — Z86718 Personal history of other venous thrombosis and embolism: Secondary | ICD-10-CM | POA: Diagnosis not present

## 2020-12-21 DIAGNOSIS — J9600 Acute respiratory failure, unspecified whether with hypoxia or hypercapnia: Secondary | ICD-10-CM | POA: Diagnosis present

## 2020-12-21 DIAGNOSIS — Z8249 Family history of ischemic heart disease and other diseases of the circulatory system: Secondary | ICD-10-CM | POA: Diagnosis not present

## 2020-12-21 DIAGNOSIS — Z86711 Personal history of pulmonary embolism: Secondary | ICD-10-CM | POA: Diagnosis not present

## 2020-12-21 DIAGNOSIS — E785 Hyperlipidemia, unspecified: Secondary | ICD-10-CM | POA: Diagnosis present

## 2020-12-21 DIAGNOSIS — Z6831 Body mass index (BMI) 31.0-31.9, adult: Secondary | ICD-10-CM

## 2020-12-21 DIAGNOSIS — E119 Type 2 diabetes mellitus without complications: Secondary | ICD-10-CM | POA: Diagnosis present

## 2020-12-21 DIAGNOSIS — F17210 Nicotine dependence, cigarettes, uncomplicated: Secondary | ICD-10-CM | POA: Diagnosis present

## 2020-12-21 DIAGNOSIS — J439 Emphysema, unspecified: Secondary | ICD-10-CM | POA: Diagnosis present

## 2020-12-21 DIAGNOSIS — I10 Essential (primary) hypertension: Secondary | ICD-10-CM | POA: Diagnosis present

## 2020-12-21 DIAGNOSIS — Z7984 Long term (current) use of oral hypoglycemic drugs: Secondary | ICD-10-CM

## 2020-12-21 DIAGNOSIS — Z79891 Long term (current) use of opiate analgesic: Secondary | ICD-10-CM

## 2020-12-21 DIAGNOSIS — E876 Hypokalemia: Secondary | ICD-10-CM | POA: Diagnosis present

## 2020-12-21 DIAGNOSIS — M1990 Unspecified osteoarthritis, unspecified site: Secondary | ICD-10-CM | POA: Diagnosis present

## 2020-12-21 DIAGNOSIS — Z90710 Acquired absence of both cervix and uterus: Secondary | ICD-10-CM | POA: Diagnosis not present

## 2020-12-21 DIAGNOSIS — F419 Anxiety disorder, unspecified: Secondary | ICD-10-CM | POA: Diagnosis present

## 2020-12-21 DIAGNOSIS — J9601 Acute respiratory failure with hypoxia: Secondary | ICD-10-CM | POA: Diagnosis present

## 2020-12-21 DIAGNOSIS — Z23 Encounter for immunization: Secondary | ICD-10-CM

## 2020-12-21 DIAGNOSIS — E669 Obesity, unspecified: Secondary | ICD-10-CM | POA: Diagnosis present

## 2020-12-21 DIAGNOSIS — K219 Gastro-esophageal reflux disease without esophagitis: Secondary | ICD-10-CM | POA: Diagnosis present

## 2020-12-21 DIAGNOSIS — G43909 Migraine, unspecified, not intractable, without status migrainosus: Secondary | ICD-10-CM | POA: Diagnosis present

## 2020-12-21 DIAGNOSIS — Z7901 Long term (current) use of anticoagulants: Secondary | ICD-10-CM

## 2020-12-21 DIAGNOSIS — R0602 Shortness of breath: Secondary | ICD-10-CM | POA: Diagnosis not present

## 2020-12-21 LAB — BASIC METABOLIC PANEL
Anion gap: 8 (ref 5–15)
BUN: 12 mg/dL (ref 8–23)
CO2: 24 mmol/L (ref 22–32)
Calcium: 8.3 mg/dL — ABNORMAL LOW (ref 8.9–10.3)
Chloride: 108 mmol/L (ref 98–111)
Creatinine, Ser: 0.71 mg/dL (ref 0.44–1.00)
GFR, Estimated: >60 mL/min (ref >60)
Glucose, Bld: 127 mg/dL — ABNORMAL HIGH (ref 70–99)
Potassium: 2.7 mmol/L — CL (ref 3.5–5.1)
Sodium: 140 mmol/L (ref 135–145)

## 2020-12-21 LAB — CBC
HCT: 39.4 % (ref 36.0–46.0)
Hemoglobin: 13.4 g/dL (ref 12.0–15.0)
MCH: 29.3 pg (ref 26.0–34.0)
MCHC: 34 g/dL (ref 30.0–36.0)
MCV: 86.2 fL (ref 80.0–100.0)
Platelets: 188 10*3/uL (ref 150–400)
RBC: 4.57 MIL/uL (ref 3.87–5.11)
RDW: 17 % — ABNORMAL HIGH (ref 11.5–15.5)
WBC: 6.6 10*3/uL (ref 4.0–10.5)
nRBC: 0 % (ref 0.0–0.2)

## 2020-12-21 LAB — TROPONIN I (HIGH SENSITIVITY)
Troponin I (High Sensitivity): 5 ng/L (ref <18)
Troponin I (High Sensitivity): 4 ng/L (ref <18)

## 2020-12-21 LAB — PROTIME-INR
INR: 1.9 — ABNORMAL HIGH (ref 0.8–1.2)
Prothrombin Time: 22.2 seconds — ABNORMAL HIGH (ref 11.4–15.2)

## 2020-12-21 LAB — MAGNESIUM: Magnesium: 1.5 mg/dL — ABNORMAL LOW (ref 1.7–2.4)

## 2020-12-21 LAB — BRAIN NATRIURETIC PEPTIDE: B Natriuretic Peptide: 57.3 pg/mL (ref 0.0–100.0)

## 2020-12-21 MED ORDER — INSULIN ASPART 100 UNIT/ML IJ SOLN: 0.0000 [IU] | Freq: Every day | INTRAMUSCULAR | Status: DC

## 2020-12-21 MED ORDER — SODIUM CHLORIDE 0.9 % IV SOLN: INTRAVENOUS | Status: DC

## 2020-12-21 MED ORDER — MAGNESIUM SULFATE 2 GM/50ML IV SOLN
2.0000 g | Freq: Once | INTRAVENOUS | Status: AC
  Administered 2020-12-21: 2 g via INTRAVENOUS
  Filled 2020-12-21: qty 50

## 2020-12-21 MED ORDER — IPRATROPIUM-ALBUTEROL 0.5-2.5 (3) MG/3ML IN SOLN
3.0000 mL | Freq: Once | RESPIRATORY_TRACT | Status: AC
  Administered 2020-12-21: 3 mL via RESPIRATORY_TRACT
  Filled 2020-12-21: qty 3

## 2020-12-21 MED ORDER — WARFARIN SODIUM 2.5 MG PO TABS
2.5000 mg | ORAL_TABLET | Freq: Once | ORAL | Status: AC
  Administered 2020-12-21: 2.5 mg via ORAL
  Filled 2020-12-21: qty 1

## 2020-12-21 MED ORDER — IOHEXOL 350 MG/ML SOLN
100.0000 mL | Freq: Once | INTRAVENOUS | Status: AC | PRN
  Administered 2020-12-21: 100 mL via INTRAVENOUS

## 2020-12-21 MED ORDER — ACETAMINOPHEN 325 MG PO TABS
650.0000 mg | ORAL_TABLET | Freq: Four times a day (QID) | ORAL | Status: DC | PRN
  Filled 2020-12-21: qty 2

## 2020-12-21 MED ORDER — POTASSIUM CHLORIDE CRYS ER 20 MEQ PO TBCR
40.0000 meq | EXTENDED_RELEASE_TABLET | Freq: Three times a day (TID) | ORAL | Status: AC
  Administered 2020-12-21 – 2020-12-22 (×2): 40 meq via ORAL
  Filled 2020-12-21 (×2): qty 2

## 2020-12-21 MED ORDER — WARFARIN SODIUM 10 MG PO TABS
10.0000 mg | ORAL_TABLET | ORAL | Status: DC
  Administered 2020-12-22: 18:00:00 10 mg via ORAL
  Filled 2020-12-21 (×2): qty 1

## 2020-12-21 MED ORDER — POTASSIUM CHLORIDE CRYS ER 20 MEQ PO TBCR
40.0000 meq | EXTENDED_RELEASE_TABLET | Freq: Once | ORAL | Status: AC
  Administered 2020-12-21: 40 meq via ORAL
  Filled 2020-12-21: qty 2

## 2020-12-21 MED ORDER — CEFTRIAXONE SODIUM 1 G IJ SOLR: 1.0000 g | INTRAMUSCULAR | Status: DC

## 2020-12-21 MED ORDER — IPRATROPIUM-ALBUTEROL 0.5-2.5 (3) MG/3ML IN SOLN
3.0000 mL | Freq: Four times a day (QID) | RESPIRATORY_TRACT | Status: DC
  Administered 2020-12-22 – 2020-12-23 (×7): 3 mL via RESPIRATORY_TRACT
  Filled 2020-12-21 (×7): qty 3

## 2020-12-21 MED ORDER — MOMETASONE FURO-FORMOTEROL FUM 200-5 MCG/ACT IN AERO
2.0000 | INHALATION_SPRAY | Freq: Two times a day (BID) | RESPIRATORY_TRACT | Status: DC
  Administered 2020-12-22 – 2020-12-23 (×3): 2 via RESPIRATORY_TRACT
  Filled 2020-12-21: qty 8.8

## 2020-12-21 MED ORDER — POTASSIUM CHLORIDE 10 MEQ/100ML IV SOLN
10.0000 meq | INTRAVENOUS | Status: AC
  Administered 2020-12-21 (×2): 10 meq via INTRAVENOUS
  Filled 2020-12-21 (×2): qty 100

## 2020-12-21 MED ORDER — METHYLPREDNISOLONE SODIUM SUCC 125 MG IJ SOLR
125.0000 mg | Freq: Once | INTRAMUSCULAR | Status: AC
  Administered 2020-12-21: 125 mg via INTRAVENOUS
  Filled 2020-12-21: qty 2

## 2020-12-21 MED ORDER — METHYLPREDNISOLONE SODIUM SUCC 125 MG IJ SOLR
125.0000 mg | Freq: Two times a day (BID) | INTRAMUSCULAR | Status: AC
  Administered 2020-12-22 (×2): 125 mg via INTRAVENOUS
  Filled 2020-12-21 (×2): qty 2

## 2020-12-21 MED ORDER — WARFARIN - PHARMACIST DOSING INPATIENT
Freq: Every day | Status: DC
  Filled 2020-12-21: qty 1

## 2020-12-21 MED ORDER — ALBUTEROL SULFATE HFA 108 (90 BASE) MCG/ACT IN AERS
2.0000 | INHALATION_SPRAY | RESPIRATORY_TRACT | Status: DC | PRN
  Filled 2020-12-21: qty 6.7

## 2020-12-21 MED ORDER — WARFARIN SODIUM 5 MG PO TABS
5.0000 mg | ORAL_TABLET | ORAL | Status: DC
  Administered 2020-12-21: 5 mg via ORAL
  Filled 2020-12-21 (×2): qty 1

## 2020-12-21 MED ORDER — SODIUM CHLORIDE 0.9 % IV SOLN
2.0000 g | Freq: Once | INTRAVENOUS | Status: AC
  Administered 2020-12-21: 2 g via INTRAVENOUS
  Filled 2020-12-21: qty 20

## 2020-12-21 MED ORDER — INSULIN ASPART 100 UNIT/ML IJ SOLN
0.0000 [IU] | Freq: Three times a day (TID) | INTRAMUSCULAR | Status: DC
  Administered 2020-12-22 (×2): 3 [IU] via SUBCUTANEOUS
  Administered 2020-12-22 – 2020-12-23 (×2): 4 [IU] via SUBCUTANEOUS
  Filled 2020-12-21 (×4): qty 1

## 2020-12-21 MED ORDER — SODIUM CHLORIDE 0.9 % IV SOLN
500.0000 mg | Freq: Once | INTRAVENOUS | Status: AC
  Administered 2020-12-21: 500 mg via INTRAVENOUS
  Filled 2020-12-21: qty 500

## 2020-12-21 MED ORDER — PREDNISONE 20 MG PO TABS
40.0000 mg | ORAL_TABLET | Freq: Every day | ORAL | Status: DC
  Administered 2020-12-23: 09:00:00 40 mg via ORAL
  Filled 2020-12-21: qty 2

## 2020-12-21 MED ORDER — ALBUTEROL SULFATE (2.5 MG/3ML) 0.083% IN NEBU
2.5000 mg | INHALATION_SOLUTION | RESPIRATORY_TRACT | Status: DC | PRN
  Administered 2020-12-22: 2.5 mg via RESPIRATORY_TRACT
  Filled 2020-12-21: qty 3

## 2020-12-21 MED ORDER — POTASSIUM CHLORIDE CRYS ER 20 MEQ PO TBCR
20.0000 meq | EXTENDED_RELEASE_TABLET | Freq: Once | ORAL | Status: AC
  Administered 2020-12-21: 20 meq via ORAL
  Filled 2020-12-21: qty 1

## 2020-12-21 MED ORDER — HYDROCOD POLST-CPM POLST ER 10-8 MG/5ML PO SUER
5.0000 mL | Freq: Once | ORAL | Status: AC
  Administered 2020-12-21: 5 mL via ORAL
  Filled 2020-12-21: qty 5

## 2020-12-21 NOTE — ED Provider Notes (Signed)
[facility] Emergency Department Provider Note  ____________________________________________   Event Date/Time   First MD Initiated Contact with Patient 12/21/20 2027     (approximate)  I have reviewed the triage vital signs and the nursing notes.   HISTORY  Chief Complaint Shortness of Breath    HPI Cathy Fox is a 65 y.o. female  with PMHx HTN, HLD, DVT/PE, here with cough, SOB. Pt reports that over the past 3 days, she's had progressively worsening cough, SOB. She's had diffuse wheezing. She has had subjective fever, chills. Reports that she has had associated increasing pressure like sensation in her chest along with feeling like she can breathe in but not out. Feels like she cannot empty her chest out. Has been taking meds without success. No known sick contacts. Sx worse w/ exertion.       Past Medical History:  Diagnosis Date   Anemia    Anxiety    Arthritis    DDD (degenerative disc disease), cervical    Depression    Diabetes mellitus without complication (HCC)    DVT (deep venous thrombosis) (HCC)    GERD (gastroesophageal reflux disease)    Headache    History of pulmonary embolus (PE) 2005   Hx of antiphospholipid antibody syndrome    Hypotension    Pancreatitis 2015   [facility] [place]   Shortness of breath dyspnea    Sleep apnea    Vertigo     Patient Active Problem List   Diagnosis Date Noted   COPD exacerbation (HCC) 12/21/2020   Hypomagnesemia 12/21/2020   Diabetes mellitus type 2, 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/13/2016   Hyponatremia 09/19/2016   Hypokalemia 09/19/2016   Leukocytosis 09/19/2016   Elevated troponin 09/19/2016   Klebsiella cystitis 09/19/2016   Generalized weakness 09/19/2016   Tobacco abuse counseling 09/19/2016   Migraine headache 09/19/2016   Acute respiratory failure (HCC) 09/16/2016   Pneumonia 01/05/2016   Antiphospholipid antibody syndrome (HCC) 02/21/2012    Past Surgical History:   Procedure Laterality Date   ABDOMINAL HYSTERECTOMY     DILATION AND CURETTAGE OF UTERUS     JOINT REPLACEMENT Left    Total knee Replacement   NASAL SEPTOPLASTY W/ TURBINOPLASTY Bilateral 12/16/2014   Procedure: NASAL SEPTOPLASTY WITH BILATERAL TURBINATE REDUCTION;  Surgeon: Linus Salmons, MD;  Location: [facility] ORS;  Service: ENT;  Laterality: Bilateral;    Prior to Admission medications   Medication Sig Start Date End Date Taking? Authorizing Provider  atorvastatin (LIPITOR) 80 MG tablet Take 80 mg by mouth daily.   Yes [provider]  busPIRone (BUSPAR) 10 MG tablet Take 20 mg by mouth 3 (three) times daily.   Yes [provider]  Cholecalciferol 50 MCG (2000 UT) TABS Take 2,000 Units by mouth daily. 05/16/18  Yes [provider]  cyanocobalamin 1000 MCG tablet Take 1,000 mcg by mouth daily. 09/24/18  Yes [provider]  folic acid (FOLVITE) 1 MG tablet Take 1 mg by mouth 2 (two) times daily.    Yes [provider]  furosemide (LASIX) 20 MG tablet Take 20-40 mg by mouth See admin instructions. Take 2 tablets (40 mg) by mouth every morning and Take 20 mg by mouth in the evening.   Yes [provider]  hydroxychloroquine (PLAQUENIL) 200 MG tablet Take 200 mg by mouth daily. 11/10/20  Yes [provider]  metFORMIN (GLUCOPHAGE) 500 MG tablet Take 500 mg by mouth every morning. 11/05/20  Yes [provider]  methocarbamol (ROBAXIN) 500 MG tablet Take 500 mg by mouth 2 (two) times daily. 12/11/20  Yes [provider]  metoprolol tartrate (LOPRESSOR) 25 MG tablet Take 25 mg by mouth 2 (two) times daily.   Yes [provider]  omeprazole (PRILOSEC) 40 MG capsule Take 40 mg by mouth 2 (two) times daily. 11/06/20  Yes [provider]  oxyCODONE-acetaminophen (PERCOCET) 10-325 MG tablet Take 1 tablet by mouth every 8 (eight) hours as needed for pain. 11/29/20  Yes [provider]  predniSONE  (DELTASONE) 20 MG tablet Take 40 mg by mouth daily. 12/17/20  Yes [provider]  pregabalin (LYRICA) 150 MG capsule Take 150 mg by mouth 3 (three) times daily.   Yes [provider]  pregabalin (LYRICA) 75 MG capsule Take 75 mg by mouth daily. 09/30/20  Yes [provider]  sertraline (ZOLOFT) 100 MG tablet Take 100 mg by mouth 2 (two) times daily. 11/30/20  Yes [provider]  SUMAtriptan (IMITREX) 100 MG tablet Take 100 mg by mouth as directed. 12/11/20  Yes [provider]  warfarin (COUMADIN) 10 MG tablet Take 5-10 mg by mouth See admin instructions. Take5 mg tablet by mouth every evening on Monday, Wednesday, Saturday. Take 10 mg tablet by mouth on Tuesday, Thursday, Friday and Sunday   Yes [provider]  albuterol (PROVENTIL) (2.5 MG/3ML) 0.083% nebulizer solution Take 3 mLs (2.5 mg total) by nebulization every 6 (six) hours as needed for wheezing or shortness of breath. Patient not taking: No sig reported 09/19/16   Katharina Caper, MD  alendronate (FOSAMAX) 70 MG tablet Take 70 mg by mouth once a week. Wednesday 10/17/20   [provider]  benzonatate (TESSALON) 100 MG capsule Take 100 mg by mouth 3 (three) times daily as needed for cough. 12/18/20   [provider]  budesonide-formoterol (SYMBICORT) 160-4.5 MCG/ACT inhaler Inhale 2 puffs into the lungs 2 (two) times daily. Patient not taking: No sig reported 09/19/16   Katharina Caper, MD  methotrexate 50 MG/2ML injection Inject 0.8 mLs into the skin once a week. Use on Thursday 08/13/15   [provider]  ondansetron (ZOFRAN) 4 MG tablet Take 4 mg by mouth daily as needed for nausea. 12/08/20   [provider]  PROAIR HFA 108 (90 Base) MCG/ACT inhaler INHALE 2 PUFFS PO Q 6 TO 8 H PRN FOR WHEEZING 03/19/15   [provider]    Allergies Hydrocodone  Family History  Problem Relation Age of Onset   Heart disease Mother    Heart disease Father     Kidney cancer Father    Colon cancer Father     Social History Social History   Tobacco Use   Smoking status: Every Day    Packs/day: 1.00    Types: Cigarettes   Smokeless tobacco: Never  Vaping Use   Vaping Use: Never used  Substance Use Topics   Alcohol use: No   Drug use: No    Review of Systems  Review of Systems  Constitutional:  Positive for chills and fatigue. Negative for fever.  HENT:  Negative for congestion and sore throat.   Eyes:  Negative for visual disturbance.  Respiratory:  Positive for cough, shortness of breath and wheezing.   Cardiovascular:  Negative for chest pain.  Gastrointestinal:  Negative for abdominal pain, diarrhea, nausea and vomiting.  Genitourinary:  Negative for flank pain.  Musculoskeletal:  Negative for back pain and neck pain.  Skin:  Negative for rash and  wound.  Neurological:  Positive for weakness.  All other systems reviewed and are negative.   ____________________________________________  PHYSICAL EXAM:      VITAL SIGNS: ED Triage Vitals  Enc Vitals Group     BP 12/21/20 1541 (!) 124/101     Pulse Rate 12/21/20 1541 72     Resp 12/21/20 1541 20     Temp 12/21/20 1541 97.8 F (36.6 C)     Temp src --      SpO2 12/21/20 1541 92 %     Weight 12/21/20 1538 190 lb 0.6 oz (86.2 kg)     Height 12/21/20 1538 5\' 5"  (1.651 m)     Head Circumference --      Peak Flow --      Pain Score --      Pain Loc --      Pain Edu? --      Excl. in GC? --      Physical Exam Vitals and nursing note reviewed.  Constitutional:      General: She is not in acute distress.    Appearance: She is well-developed.  HENT:     Head: Normocephalic and atraumatic.  Eyes:     Conjunctiva/sclera: Conjunctivae normal.  Cardiovascular:     Rate and Rhythm: Normal rate and regular rhythm.     Heart sounds: Normal heart sounds.  Pulmonary:     Effort: Pulmonary effort is normal. Tachypnea present. No respiratory distress.     Breath sounds:  Examination of the right-middle field reveals rhonchi. Examination of the left-middle field reveals rhonchi. Examination of the right-lower field reveals rhonchi. Examination of the left-lower field reveals rhonchi. Wheezing and rhonchi present.  Abdominal:     General: There is no distension.  Musculoskeletal:     Cervical back: Neck supple.  Skin:    General: Skin is warm.     Capillary Refill: Capillary refill takes less than 2 seconds.     Findings: No rash.  Neurological:     Mental Status: She is alert and oriented to person, place, and time.     Motor: No abnormal muscle tone.      ____________________________________________   LABS (all labs ordered are listed, but only abnormal results are displayed)  Labs Reviewed  CBC - Abnormal; Notable for the following components:      Result Value   RDW 17.0 (*)    All other components within normal limits  BASIC METABOLIC PANEL - Abnormal; Notable for the following components:   Potassium 2.7 (*)    Glucose, Bld 127 (*)    Calcium 8.3 (*)    All other components within normal limits  PROTIME-INR - Abnormal; Notable for the following components:   Prothrombin Time 22.2 (*)    INR 1.9 (*)    All other components within normal limits  MAGNESIUM - Abnormal; Notable for the following components:   Magnesium 1.5 (*)    All other components within normal limits  CULTURE, BLOOD (ROUTINE X 2)  CULTURE, BLOOD (ROUTINE X 2)  BRAIN NATRIURETIC PEPTIDE  COMPREHENSIVE METABOLIC PANEL  CBC WITH DIFFERENTIAL/PLATELET  PROTIME-INR  HEMOGLOBIN A1C  HIV ANTIBODY (ROUTINE TESTING W REFLEX)  TROPONIN I (HIGH SENSITIVITY)  TROPONIN I (HIGH SENSITIVITY)    ____________________________________________  EKG: Normal sinus rhythm, VR 73. PR 144, QRS 88, QTc 449. Nonspecific TWA. No acute ST elevations. ________________________________________  RADIOLOGY All imaging, including plain films, CT scans, and ultrasounds, independently reviewed  by me, and interpretations  confirmed via formal radiology reads.  ED MD interpretation:   CXR: Clear CT Angio: Bronchopneumonia, no PE  Official radiology report(s): DG Chest 2 View  Result Date: 12/21/2020 CLINICAL DATA:  Shortness of breath. EXAM: CHEST - 2 VIEW COMPARISON:  05/23/2017 FINDINGS: Coarse lung markings in the lower lungs appear to be chronic. No large areas of airspace disease or consolidation. Heart size is normal. The trachea is midline. Chronic sclerosis and probable posttraumatic changes in the proximal left humerus. IMPRESSION: Chronic changes without acute findings. Electronically Signed   By: Richarda Overlie M.D.   On: 12/21/2020 16:39   CT Angio Chest PE W and/or Wo Contrast  Result Date: 12/21/2020 CLINICAL DATA:  Short of breath, history of DVT/PE on anticoagulation, hypoxia EXAM: CT ANGIOGRAPHY CHEST WITH CONTRAST TECHNIQUE: Multidetector CT imaging of the chest was performed using the standard protocol during bolus administration of intravenous contrast. Multiplanar CT image reconstructions and MIPs were obtained to evaluate the vascular anatomy. CONTRAST:  OMNIPAQUE IOHEXOL 350 MG/ML SOLN COMPARISON:  12/21/2020, 10/31/2018 FINDINGS: Cardiovascular: This is a technically adequate evaluation of the pulmonary vasculature. No filling defects or pulmonary emboli. The heart is unremarkable without pericardial effusion. Normal caliber of the thoracic aorta. Minimal atherosclerosis of the aortic arch and coronary vasculature. Mediastinum/Nodes: 2.0 cm hypodense nodule right lobe thyroid is unchanged since 2018. No pathologic adenopathy. Trachea and esophagus are unremarkable. Lungs/Pleura: Mild background emphysema. Mild bronchial wall thickening at the lung bases, with scattered ground-glass airspace disease which could reflect early bronchopneumonia. No effusion or pneumothorax. Central airways are patent. Upper Abdomen: No acute abnormality. Musculoskeletal: No acute or  destructive bony lesions. Reconstructed images demonstrate no additional findings. Review of the MIP images confirms the above findings. IMPRESSION: 1. No evidence of pulmonary embolus. 2. Mild bibasilar bronchial wall thickening with scattered lower lobe airspace disease. Findings favor early bronchopneumonia. 3.  Aortic Atherosclerosis (ICD10-I70.0). 4. 2 cm hypodense right lobe thyroid nodule. Recommend thyroid US if not previously performed. Reference: J Am Coll Radiol. 2015 Feb;12(2): 143-50 Electronically Signed   By: Sharlet Salina M.D.   On: 12/21/2020 17:43    ____________________________________________  PROCEDURES   Procedure(s) performed (including Critical Care):  .Critical Care Performed by: Shaune Pollack, MD Authorized by: Shaune Pollack, MD   Critical care provider statement:    Critical care time (minutes):  35   Critical care time was exclusive of:  Separately billable procedures and treating other patients and teaching time   Critical care was necessary to treat or prevent imminent or life-threatening deterioration of the following conditions:  Cardiac failure, circulatory failure and respiratory failure   Critical care was time spent personally by me on the following activities:  Development of treatment plan with patient or surrogate, discussions with consultants, evaluation of patient's response to treatment, examination of patient, obtaining history from patient or surrogate, ordering and performing treatments and interventions, ordering and review of laboratory studies, ordering and review of radiographic studies, pulse oximetry, re-evaluation of patient's condition and review of old charts   I assumed direction of critical care for this patient from another provider in my specialty: no    ____________________________________________  INITIAL IMPRESSION / MDM / ASSESSMENT AND PLAN / ED COURSE  As part of my medical decision making, I reviewed the following data  within the electronic MEDICAL RECORD NUMBER Nursing notes reviewed and incorporated, Old chart reviewed, Notes from prior ED visits, and [place] Controlled Substance Database       *Cathy Fox was  evaluated in Emergency Department on 12/22/2020 for the symptoms described in the history of present illness. She was evaluated in the context of the global COVID-19 pandemic, which necessitated consideration that the patient might be at risk for infection with the SARS-CoV-2 virus that causes COVID-19. Institutional protocols and algorithms that pertain to the evaluation of patients at risk for COVID-19 are in a state of rapid change based on information released by regulatory bodies including the CDC and federal and state organizations. These policies and algorithms were followed during the patient's care in the ED.  Some ED evaluations and interventions may be delayed as a result of limited staffing during the pandemic.*     Medical Decision Making:  65 yo F here with cough, SOB, acute on chronic hypoxia. CXR shows no acute changes. Pt with diffuse wheezing, increased WOB on exam. Given IV steroids, nebs, and supplemental O2. Pt has a h/o DVT as well, so CT angio obtained, reviewed. No PE on CT but CT does show likely bronchopneumonia. Suspect acute on chronic hypoxic resp failure from COPD with CAP. Will start empiric ABX, admit to medicine. Pt and family updated. EKG nonischemic, no signs of ACS.  ____________________________________________  FINAL CLINICAL IMPRESSION(S) / ED DIAGNOSES  Final diagnoses:  Community acquired pneumonia, unspecified laterality  Acute respiratory failure with hypoxia (HCC)     MEDICATIONS GIVEN DURING THIS VISIT:  Medications  albuterol (VENTOLIN HFA) 108 (90 Base) MCG/ACT inhaler 2 puff (has no administration in time range)  insulin aspart (novoLOG) injection 0-20 Units (has no administration in time range)  insulin aspart (novoLOG) injection 0-5 Units (0 Units  Subcutaneous Not Given 12/21/20 2337)  0.9 %  sodium chloride infusion (has no administration in time range)  cefTRIAXone (ROCEPHIN) 1 g in sodium chloride 0.9 % 100 mL IVPB (1 g Intravenous Not Given 12/21/20 2304)  methylPREDNISolone sodium succinate (SOLU-MEDROL) 125 mg/2 mL injection 125 mg (has no administration in time range)    Followed by  predniSONE (DELTASONE) tablet 40 mg (has no administration in time range)  mometasone-formoterol (DULERA) 200-5 MCG/ACT inhaler 2 puff (0 puffs Inhalation Hold 12/21/20 2338)  albuterol (PROVENTIL) (2.5 MG/3ML) 0.083% nebulizer solution 2.5 mg (has no administration in time range)  ipratropium-albuterol (DUONEB) 0.5-2.5 (3) MG/3ML nebulizer solution 3 mL (0 mLs Nebulization Hold 12/21/20 2344)  potassium chloride SA (KLOR-CON) CR tablet 40 mEq (40 mEq Oral Given 12/21/20 2344)  acetaminophen (TYLENOL) tablet 650 mg (has no administration in time range)  warfarin (COUMADIN) tablet 5 mg (5 mg Oral Given 12/21/20 2303)  warfarin (COUMADIN) tablet 10 mg (has no administration in time range)  Warfarin - Pharmacist Dosing Inpatient (has no administration in time range)  potassium chloride SA (KLOR-CON) CR tablet 40 mEq (40 mEq Oral Given 12/21/20 1740)  iohexol (OMNIPAQUE) 350 MG/ML injection 100 mL (100 mLs Intravenous Contrast Given 12/21/20 1654)  potassium chloride SA (KLOR-CON) CR tablet 20 mEq (20 mEq Oral Given 12/21/20 2048)  potassium chloride 10 mEq in 100 mL IVPB (0 mEq Intravenous Stopped 12/21/20 2317)  magnesium sulfate IVPB 2 g 50 mL (0 g Intravenous Stopped 12/21/20 2156)  ipratropium-albuterol (DUONEB) 0.5-2.5 (3) MG/3ML nebulizer solution 3 mL (3 mLs Nebulization Given 12/21/20 2110)  ipratropium-albuterol (DUONEB) 0.5-2.5 (3) MG/3ML nebulizer solution 3 mL (3 mLs Nebulization Given 12/21/20 2112)  ipratropium-albuterol (DUONEB) 0.5-2.5 (3) MG/3ML nebulizer solution 3 mL (3 mLs Nebulization Given 12/21/20 2115)  methylPREDNISolone sodium succinate  (SOLU-MEDROL) 125 mg/2 mL injection 125 mg (125 mg Intravenous Given 12/21/20 2117)  cefTRIAXone (ROCEPHIN) 2 g in sodium chloride 0.9 % 100 mL IVPB (0 g Intravenous Stopped 12/21/20 2148)  azithromycin (ZITHROMAX) 500 mg in sodium chloride 0.9 % 250 mL IVPB (0 mg Intravenous Stopped 12/21/20 2317)  chlorpheniramine-HYDROcodone (TUSSIONEX) 10-8 MG/5ML suspension 5 mL (5 mLs Oral Given 12/21/20 2116)  warfarin (COUMADIN) tablet 2.5 mg (2.5 mg Oral Given 12/21/20 2344)     ED Discharge Orders     None        Note:  This document was prepared using Dragon voice recognition software and may include unintentional dictation errors.   Shaune Pollack, MD 12/22/20 [phone]

## 2020-12-21 NOTE — ED Notes (Signed)
Patient assisted into recliner upon request

## 2020-12-21 NOTE — Progress Notes (Signed)
ANTICOAGULATION CONSULT NOTE - Initial Consult  Pharmacy Consult for Warfarin  Indication: Antiphospholipid antibody syndrome  No Known Allergies  Patient Measurements: Height: 5\' 5"  (165.1 cm) Weight: 86.2 kg (190 lb 0.6 oz) IBW/kg (Calculated) : 57 Heparin Dosing Weight:   Vital Signs: Temp: 97.8 F (36.6 C) (09/26 1541) BP: 125/73 (09/26 2300) Pulse Rate: 63 (09/26 2300)  Labs: Recent Labs    12/21/20 1546 12/21/20 2047  HGB 13.4  --   HCT 39.4  --   PLT 188  --   LABPROT 22.2*  --   INR 1.9*  --   CREATININE 0.71  --   TROPONINIHS 5 4    Estimated Creatinine Clearance: 76 mL/min (by C-G formula based on SCr of 0.71 mg/dL).   Medical History: Past Medical History:  Diagnosis Date   Anemia    Anxiety    Arthritis    DDD (degenerative disc disease), cervical    Depression    Diabetes mellitus without complication (HCC)    DVT (deep venous thrombosis) (HCC)    GERD (gastroesophageal reflux disease)    Headache    History of pulmonary embolus (PE) 2005   Hx of antiphospholipid antibody syndrome    Hypotension    Pancreatitis 2015   [facility] [place]   Shortness of breath dyspnea    Sleep apnea    Vertigo     Medications:  (Not in a hospital admission)   Assessment: Pharmacy consulted to dose warfarin in this 65 year old female with antiphospholipid antibody syndrome.   Pt was on warfarin 5 mg PO every Mon, Wed and Sat   and warfarin 10 mg PO every Tues, Thurs, Frid and Sun.   Last dose was on 9/25 PM.  9/26 INR @ 1546 = 1.9  Goal of Therapy:  INR 2-3    Plan:  INR on 9/26 @ 1546 = 1.9, subtherapeutic Pt has not had dose on 9/26.   Will order warfarin 7.5 mg PO X 1 for 9/26 and resume home schedule on 9/27. Will recheck INR on 9/27 @ 0800.    Layla Gramm D 12/21/2020,11:12 PM

## 2020-12-21 NOTE — ED Notes (Signed)
Pt comes via EMs with c/o SOb. Pt stats it started today. Pt given duoneb and albuterol.  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

## 2020-12-21 NOTE — H&P (Signed)
History and Physical   Cathy Fox:[id number] DOB: 1955/05/20 DOA: 12/21/2020  Referring MD/NP/PA: Dr. Erma Heritage  PCP: Warren Lacy, MD   Outpatient Specialists: None  Patient coming from: Home  Chief Complaint: Shortness of breath  HPI: Cathy Fox is a 65 y.o. female with medical history significant of COPD, diabetes, coagulopathy with antiphospholipid syndrome, obstructive sleep apnea, morbid obesity, depression, GERD, essential hypertension and hyperlipidemia who presents to the ER with shortness of breath cough and hypoxia.  She is not on home O2.  When EMS arrived her oxygen sats was 84% on room air.  She is currently on 2 L of oxygen.  Patient has history of coagulopathy and previous DVTs and PE.  She is on warfarin with INR 1.9.  Patient seen in the ER and evaluated.  She has significant respiratory distress and wheezing.  Also reports some headaches.  Her symptoms have been going on now for 4 days not responding to her home regimen.  She had chest x-rays and CT angiogram checked showing no PE.  Patient appears to be having acute respiratory failure with hypoxia secondary to COPD exacerbation.  She has ongoing tobacco use.  She will be admitted for further evaluation and treatment.  ED Course: Temperature 97.8 blood pressure 130s over 62, pulse 73 respiratory rate of 29 oxygen sats 92% on 2 L.  Sodium 140 potassium 2.7 chloride 108 CO2 24 glucose 127 BUN 12 creatinine 0.71.  Magnesium is 1.5.  White count 6.6 the rest of the CBC within normal.  Chest x-ray showed showing chronic changes.  CT angiogram of the chest showed scattered lower lobe pneumonia but no PE.  PT 22 and 1.9.  Patient being admitted with acute aspiratory failure secondary to pneumonia and COPD exacerbation.  Review of Systems: As per HPI otherwise 10 point review of systems negative.    Past Medical History:  Diagnosis Date   Anemia    Anxiety    Arthritis    DDD (degenerative disc disease), cervical     Depression    Diabetes mellitus without complication (HCC)    DVT (deep venous thrombosis) (HCC)    GERD (gastroesophageal reflux disease)    Headache    History of pulmonary embolus (PE) 2005   Hx of antiphospholipid antibody syndrome    Hypotension    Pancreatitis 2015   [facility] [place]   Shortness of breath dyspnea    Sleep apnea    Vertigo     Past Surgical History:  Procedure Laterality Date   ABDOMINAL HYSTERECTOMY     DILATION AND CURETTAGE OF UTERUS     JOINT REPLACEMENT Left    Total knee Replacement   NASAL SEPTOPLASTY W/ TURBINOPLASTY Bilateral 12/16/2014   Procedure: NASAL SEPTOPLASTY WITH BILATERAL TURBINATE REDUCTION;  Surgeon: Linus Salmons, MD;  Location: [facility] ORS;  Service: ENT;  Laterality: Bilateral;     reports that she has been smoking cigarettes. She has been smoking an average of 1 pack per day. She has never used smokeless tobacco. She reports that she does not drink alcohol and does not use drugs.  No Known Allergies  Family History  Problem Relation Age of Onset   Heart disease Mother    Heart disease Father    Kidney cancer Father    Colon cancer Father      Prior to Admission medications   Medication Sig Start Date End Date Taking? Authorizing Provider  albuterol (PROVENTIL) (2.5 MG/3ML) 0.083% nebulizer solution Take 3 mLs (  2.5 mg total) by nebulization every 6 (six) hours as needed for wheezing or shortness of breath. 09/19/16   Katharina Caper, MD  atorvastatin (LIPITOR) 80 MG tablet Take 80 mg by mouth daily.    [provider]  azithromycin (ZITHROMAX Z-PAK) 250 MG tablet Take as directed Patient not taking: Reported on 10/31/2018 09/19/16   Katharina Caper, MD  budesonide-formoterol [facility]) 160-4.5 MCG/ACT inhaler Inhale 2 puffs into the lungs 2 (two) times daily. 09/19/16   Katharina Caper, MD  busPIRone (BUSPAR) 10 MG tablet Take 20 mg by mouth 3 (three) times daily.    [provider]  cefdinir (OMNICEF) 300 MG  capsule Take 1 capsule (300 mg total) by mouth 2 (two) times daily. 09/19/16   Katharina Caper, MD  dextromethorphan (DELSYM) 30 MG/5ML liquid Take 5 mLs (30 mg total) by mouth 2 (two) times daily. 09/19/16   Katharina Caper, MD  diazepam (VALIUM) 5 MG tablet Take 7.5 mg by mouth 3 (three) times daily. for anxiety 04/03/15   [provider]  docusate sodium (STOOL SOFTENER) 100 MG capsule Take 100 mg by mouth 2 (two) times daily.    [provider]  esomeprazole (NEXIUM) 40 MG capsule Take 40 mg by mouth daily at 12 noon.    [provider]  ferrous sulfate 325 (65 FE) MG tablet Take 325 mg by mouth daily with breakfast.    [provider]  FLUoxetine (PROZAC) 20 MG capsule Take 3 capsules (60 mg total) by mouth daily. 09/19/16   Katharina Caper, MD  fluticasone (FLONASE) 50 MCG/ACT nasal spray Place 1 spray into both nostrils daily. 01/08/16   Hower, Cletis [place], MD  folic acid (FOLVITE) 1 MG tablet Take 1 mg by mouth 2 (two) times daily.     [provider]  furosemide (LASIX) 20 MG tablet Take 20-40 mg by mouth See admin instructions. Take 2 tablets (40 mg) by mouth every morning and Take 20 mg by mouth in the evening.    [provider]  guaiFENesin (MUCINEX) 600 MG 12 hr tablet Take 1 tablet (600 mg total) by mouth 2 (two) times daily. 09/19/16   Katharina Caper, MD  lisinopril (PRINIVIL,ZESTRIL) 20 MG tablet Take 1 tablet (20 mg total) by mouth daily. 09/19/16 09/19/17  Katharina Caper, MD  metFORMIN (GLUCOPHAGE) 1000 MG tablet Take 1,000 mg by mouth 2 (two) times daily with a meal.    [provider]  methotrexate 50 MG/2ML injection Inject 0.8 mLs into the skin once a week. Use on Wednesday. 08/13/15   [provider]  metoprolol tartrate (LOPRESSOR) 25 MG tablet Take 25 mg by mouth 2 (two) times daily.    [provider]  morphine (MS CONTIN) 15 MG 12 hr tablet Take 15 mg by mouth every 12 (twelve) hours.    [provider]  nicotine (NICODERM CQ - DOSED IN MG/24 HOURS) 21 mg/24hr patch Place 21 mg onto the skin daily.    [provider]  orphenadrine (NORFLEX) 100 MG tablet Take 1 tablet (100 mg total) by mouth 2 (two) times daily. 09/19/16   Katharina Caper, MD  predniSONE (STERAPRED UNI-PAK 21 TAB) 10 MG (21) TBPK tablet Please take 6 pills in the morning on the day 1, then taper by 1 pill daily until finished, thank you 09/19/16   Katharina Caper, MD  pregabalin (LYRICA) 150 MG capsule Take 150 mg by mouth 3 (three) times daily.    [provider]  PROAIR HFA 108 (  90 Base) MCG/ACT inhaler INHALE 2 PUFFS PO Q 6 TO 8 H PRN FOR WHEEZING 03/19/15   [provider]  promethazine (PHENERGAN) 25 MG tablet Take 25 mg by mouth every 6 (six) hours as needed for nausea or vomiting.    [provider]  tiotropium (SPIRIVA) 18 MCG inhalation capsule Place 1 capsule (18 mcg total) into inhaler and inhale daily. 01/07/16   Hower, Cletis Athens, MD  tiZANidine (ZANAFLEX) 4 MG tablet Take 4 mg by mouth 4 (four) times daily -  with meals and at bedtime.     [provider]  warfarin (COUMADIN) 10 MG tablet Take 5-10 mg by mouth See admin instructions. Take5 mg tablet by mouth every evening on Monday, Wednesday, Saturday. Take 10 mg tablet by mouth on Tuesday, Thursday, Friday and Sunday    [provider]    Physical Exam: Vitals:   12/21/20 1745 12/21/20 2128 12/21/20 2130 12/21/20 2215  BP: (!) 124/54 122/75 125/90 137/62  Pulse: 69 65 64 73  Resp: 19 (!) 29 20 17   Temp:      SpO2: 97% 99% 99% 94%  Weight:      Height:          Constitutional: Acutely ill looking in obvious respiratory distress v Vitals:   12/21/20 1745 12/21/20 2128 12/21/20 2130 12/21/20 2215  BP: (!) 124/54 122/75 125/90 137/62  Pulse: 69 65 64 73  Resp: 19 (!) 29 20 17   Temp:      SpO2: 97% 99% 99% 94%  Weight:      Height:       Eyes: PERRL, lids and conjunctivae normal ENMT: Mucous membranes  are moist. Posterior pharynx clear of any exudate or lesions.Normal dentition.  Neck: normal, supple, no masses, no thyromegaly Respiratory: decreased air entry bilaterally with marked expiratory wheezing, , no crackles.  Has accessory muscle use.  Cardiovascular: Regular rate and rhythm, no murmurs / rubs / gallops. No extremity edema. 2+ pedal pulses. No carotid bruits.  Abdomen: no tenderness, no masses palpated. No hepatosplenomegaly. Bowel sounds positive.  Musculoskeletal: no clubbing / cyanosis. No joint deformity upper and lower extremities. Good ROM, no contractures. Normal muscle tone.  Skin: no rashes, lesions, ulcers. No induration Neurologic: CN 2-12 grossly intact. Sensation intact, DTR normal. Strength 5/5 in all 4.  Psychiatric: Normal judgment and insight. Alert and oriented x 3. Normal mood.     Labs on Admission: I have personally reviewed following labs and imaging studies  CBC: Recent Labs  Lab 12/21/20 1546  WBC 6.6  HGB 13.4  HCT 39.4  MCV 86.2  PLT 188   Basic Metabolic Panel: Recent Labs  Lab 12/21/20 1546  NA 140  K 2.7*  CL 108  CO2 24  GLUCOSE 127*  BUN 12  CREATININE 0.71  CALCIUM 8.3*  MG 1.5*   GFR: Estimated Creatinine Clearance: 76 mL/min (by C-G formula based on SCr of 0.71 mg/dL). Liver Function Tests: No results for input(s): AST, ALT, ALKPHOS, BILITOT, PROT, ALBUMIN in the last 168 hours. No results for input(s): LIPASE, AMYLASE in the last 168 hours. No results for input(s): AMMONIA in the last 168 hours. Coagulation Profile: Recent Labs  Lab 12/21/20 1546  INR 1.9*   Cardiac Enzymes: No results for input(s): CKTOTAL, CKMB, CKMBINDEX, TROPONINI in the last 168 hours. BNP (last 3 results) No results for input(s): PROBNP in the last 8760 hours. HbA1C: No results for input(s): HGBA1C in the last 72 hours. CBG: No results  for input(s): GLUCAP in the last 168 hours. Lipid Profile: No results for input(s): CHOL, HDL, LDLCALC,  TRIG, CHOLHDL, LDLDIRECT in the last 72 hours. Thyroid Function Tests: No results for input(s): TSH, T4TOTAL, FREET4, T3FREE, THYROIDAB in the last 72 hours. Anemia Panel: No results for input(s): VITAMINB12, FOLATE, FERRITIN, TIBC, IRON, RETICCTPCT in the last 72 hours. Urine analysis:    Component Value Date/Time   COLORURINE YELLOW (A) 09/16/2016 0445   APPEARANCEUR CLOUDY (A) 09/16/2016 0445   LABSPEC 1.014 09/16/2016 0445   PHURINE 5.0 09/16/2016 0445   GLUCOSEU NEGATIVE 09/16/2016 0445   HGBUR SMALL (A) 09/16/2016 0445   BILIRUBINUR NEGATIVE 09/16/2016 0445   KETONESUR NEGATIVE 09/16/2016 0445   PROTEINUR NEGATIVE 09/16/2016 0445   NITRITE NEGATIVE 09/16/2016 0445   LEUKOCYTESUR LARGE (A) 09/16/2016 0445   Sepsis Labs: @LABRCNTIP (procalcitonin:4,lacticidven:4) )No results found for this or any previous visit (from the past 240 hour(s)).   Radiological Exams on Admission: DG Chest 2 View  Result Date: 12/21/2020 CLINICAL DATA:  Shortness of breath. EXAM: CHEST - 2 VIEW COMPARISON:  05/23/2017 FINDINGS: Coarse lung markings in the lower lungs appear to be chronic. No large areas of airspace disease or consolidation. Heart size is normal. The trachea is midline. Chronic sclerosis and probable posttraumatic changes in the proximal left humerus. IMPRESSION: Chronic changes without acute findings. Electronically Signed   By: 05/25/2017 M.D.   On: 12/21/2020 16:39   CT Angio Chest PE W and/or Wo Contrast  Result Date: 12/21/2020 CLINICAL DATA:  Short of breath, history of DVT/PE on anticoagulation, hypoxia EXAM: CT ANGIOGRAPHY CHEST WITH CONTRAST TECHNIQUE: Multidetector CT imaging of the chest was performed using the standard protocol during bolus administration of intravenous contrast. Multiplanar CT image reconstructions and MIPs were obtained to evaluate the vascular anatomy. CONTRAST:  12/23/2020 OMNIPAQUE IOHEXOL 350 MG/ML SOLN COMPARISON:  12/21/2020, 10/31/2018 FINDINGS:  Cardiovascular: This is a technically adequate evaluation of the pulmonary vasculature. No filling defects or pulmonary emboli. The heart is unremarkable without pericardial effusion. Normal caliber of the thoracic aorta. Minimal atherosclerosis of the aortic arch and coronary vasculature. Mediastinum/Nodes: 2.0 cm hypodense nodule right lobe thyroid is unchanged since 2018. No pathologic adenopathy. Trachea and esophagus are unremarkable. Lungs/Pleura: Mild background emphysema. Mild bronchial wall thickening at the lung bases, with scattered ground-glass airspace disease which could reflect early bronchopneumonia. No effusion or pneumothorax. Central airways are patent. Upper Abdomen: No acute abnormality. Musculoskeletal: No acute or destructive bony lesions. Reconstructed images demonstrate no additional findings. Review of the MIP images confirms the above findings. IMPRESSION: 1. No evidence of pulmonary embolus. 2. Mild bibasilar bronchial wall thickening with scattered lower lobe airspace disease. Findings favor early bronchopneumonia. 3.  Aortic Atherosclerosis (ICD10-I70.0). 4. 2 cm hypodense right lobe thyroid nodule. Recommend thyroid 2019 if not previously performed. Reference: J Am Coll Radiol. 2015 Feb;12(2): 143-50 Electronically Signed   By: 03-15-1996 M.D.   On: 12/21/2020 17:43    EKG: Independently reviewed.  Sinus tachycardia otherwise no significant  Assessment/Plan Principal Problem:   Acute respiratory failure (HCC) Active Problems:   Pneumonia   Hyponatremia   Hypokalemia   Generalized weakness   Tobacco abuse counseling   Diabetes mellitus type 2, insulin dependent (HCC)   Antiphospholipid antibody syndrome (HCC)   COPD exacerbation (HCC)   Hypomagnesemia     #1 acute respiratory failure with hypoxia: Secondary to pneumonia and COPD exacerbation.  Patient will be admitted.  Initiate IV Solu-Medrol, IV antibiotics, breathing treatments and continue oxygen  until  titrated.  Tobacco cessation counseling.  #2 community-acquired pneumonia: Initiate IV Rocephin and Zithromax.  Obtain blood cultures and follow closely.  #3 hypokalemia: Potassium 2.7.  Most likely due to hypomagnesemia.  Will replete potassium.  #4 hypomagnesemia: Replete magnesium  #5 tobacco abuse: Initiate nicotine patch antibiotic cessation counseling.  #6 COPD with acute exacerbation: Continue per #1 above.  #7 antiphospholipid syndrome: Continue warfarin per pharmacy.  However goal is 2.5 INR currently 1.9.  She may require bridging.  #8 hyponatremia: Hydrate with saline and monitor sodium level  #9 diabetes: Non-insulin-dependent.  Initiate sliding scale insulin.  Hold metformin   DVT prophylaxis: Warfarin Code Status: Full code Family Communication: No family at bedside Disposition Plan: Home Consults called: None Admission status: Inpatient  Severity of Illness: The appropriate patient status for this patient is INPATIENT. Inpatient status is judged to be reasonable and necessary in order to provide the required intensity of service to ensure the patient's safety. The patient's presenting symptoms, physical exam findings, and initial radiographic and laboratory data in the context of their chronic comorbidities is felt to place them at high risk for further clinical deterioration. Furthermore, it is not anticipated that the patient will be medically stable for discharge from the hospital within 2 midnights of admission. The following factors support the patient status of inpatient.   " The patient's presenting symptoms include shortness of breath. " The worrisome physical exam findings include marked expiratory wheezing. " The initial radiographic and laboratory data are worrisome because of potassium 3.7. " The chronic co-morbidities include tobacco abuse.   * I certify that at the point of admission it is my clinical judgment that the patient will require inpatient  hospital care spanning beyond 2 midnights from the point of admission due to high intensity of service, high risk for further deterioration and high frequency of surveillance required.Lonia Blood MD [facility] Pager [phone]  If 7PM-7AM, please contact night-coverage [email or web address] Password [facility]  12/21/2020, 10:32 PM

## 2020-12-21 NOTE — ED Triage Notes (Addendum)
C/O SOB and chest pain since Thursday.  STates has history of  DVT/PE.  Also C/O sore throat and headache.  AAOx3.  Skin warm and dry. NAD

## 2020-12-21 NOTE — ED Notes (Signed)
MD Mikeal Hawthorne notified of patient's request for medication for headache

## 2020-12-21 NOTE — ED Provider Notes (Signed)
Emergency Medicine Provider Triage Evaluation Note  Cathy Fox , a 65 y.o. female  was evaluated in triage.  Pt complains of SOB with a history of DVT/PE, currently on coumadin. She called EMS when her pulse ox read 84% RA at home. She does not normally use O2. She noted onset of symptoms on Thursday.   Review of Systems  Positive: SOB, cough Negative: FCS  Physical Exam  BP (!) 124/101 (BP Location: Left Arm)   Pulse 72   Temp 97.8 F (36.6 C)   Resp 20   Ht 5\' 5"  (1.651 m)   Wt 86.2 kg   SpO2 92%   BMI 31.62 kg/m  Gen:   Awake, no distress  NAD Resp:  Normal effort bilateral rhonchi MSK:   Moves extremities without difficulty  Other:  CVS: RRR  Medical Decision Making  Medically screening exam initiated at 3:48 PM.  Appropriate orders placed.  ALAJA GOLDINGER was informed that the remainder of the evaluation will be completed by another provider, this initial triage assessment does not replace that evaluation, and the importance of remaining in the ED until their evaluation is complete.  Patient with anticoagulation therapy with c/o SOB and hypoxia with new O2 requirement.    [name], PA-C 12/21/20 1551    12/23/20, MD 12/22/20 [phone]

## 2020-12-22 ENCOUNTER — Encounter: Payer: Self-pay | Admitting: Internal Medicine

## 2020-12-22 DIAGNOSIS — J441 Chronic obstructive pulmonary disease with (acute) exacerbation: Secondary | ICD-10-CM | POA: Diagnosis not present

## 2020-12-22 DIAGNOSIS — E119 Type 2 diabetes mellitus without complications: Secondary | ICD-10-CM | POA: Diagnosis not present

## 2020-12-22 DIAGNOSIS — J9601 Acute respiratory failure with hypoxia: Secondary | ICD-10-CM | POA: Diagnosis not present

## 2020-12-22 DIAGNOSIS — D6861 Antiphospholipid syndrome: Secondary | ICD-10-CM | POA: Diagnosis not present

## 2020-12-22 LAB — CBC WITH DIFFERENTIAL/PLATELET
Abs Immature Granulocytes: 0.03 10*3/uL (ref 0.00–0.07)
Basophils Absolute: 0 10*3/uL (ref 0.0–0.1)
Basophils Relative: 0 %
Eosinophils Absolute: 0 10*3/uL (ref 0.0–0.5)
Eosinophils Relative: 0 %
HCT: 36.2 % (ref 36.0–46.0)
Hemoglobin: 12.6 g/dL (ref 12.0–15.0)
Immature Granulocytes: 1 %
Lymphocytes Relative: 10 %
Lymphs Abs: 0.5 10*3/uL — ABNORMAL LOW (ref 0.7–4.0)
MCH: 30.9 pg (ref 26.0–34.0)
MCHC: 34.8 g/dL (ref 30.0–36.0)
MCV: 88.7 fL (ref 80.0–100.0)
Monocytes Absolute: 0.2 10*3/uL (ref 0.1–1.0)
Monocytes Relative: 3 %
Neutro Abs: 4.6 10*3/uL (ref 1.7–7.7)
Neutrophils Relative %: 86 %
Platelets: 182 10*3/uL (ref 150–400)
RBC: 4.08 MIL/uL (ref 3.87–5.11)
RDW: 17.5 % — ABNORMAL HIGH (ref 11.5–15.5)
WBC: 5.3 10*3/uL (ref 4.0–10.5)
nRBC: 0 % (ref 0.0–0.2)

## 2020-12-22 LAB — COMPREHENSIVE METABOLIC PANEL
ALT: 14 U/L (ref 0–44)
AST: 19 U/L (ref 15–41)
Albumin: 3.6 g/dL (ref 3.5–5.0)
Alkaline Phosphatase: 67 U/L (ref 38–126)
Anion gap: 12 (ref 5–15)
BUN: 10 mg/dL (ref 8–23)
CO2: 23 mmol/L (ref 22–32)
Calcium: 8.4 mg/dL — ABNORMAL LOW (ref 8.9–10.3)
Chloride: 105 mmol/L (ref 98–111)
Creatinine, Ser: 0.61 mg/dL (ref 0.44–1.00)
GFR, Estimated: >60 mL/min (ref >60)
Glucose, Bld: 187 mg/dL — ABNORMAL HIGH (ref 70–99)
Potassium: 3.9 mmol/L (ref 3.5–5.1)
Sodium: 140 mmol/L (ref 135–145)
Total Bilirubin: 0.4 mg/dL (ref 0.3–1.2)
Total Protein: 6.7 g/dL (ref 6.5–8.1)

## 2020-12-22 LAB — CBG MONITORING, ED
Glucose-Capillary: 139 mg/dL — ABNORMAL HIGH (ref 70–99)
Glucose-Capillary: 147 mg/dL — ABNORMAL HIGH (ref 70–99)

## 2020-12-22 LAB — PROTIME-INR
INR: 2 — ABNORMAL HIGH (ref 0.8–1.2)
Prothrombin Time: 22.5 seconds — ABNORMAL HIGH (ref 11.4–15.2)

## 2020-12-22 LAB — GLUCOSE, CAPILLARY
Glucose-Capillary: 178 mg/dL — ABNORMAL HIGH (ref 70–99)
Glucose-Capillary: 116 mg/dL — ABNORMAL HIGH (ref 70–99)

## 2020-12-22 LAB — RESP PANEL BY RT-PCR (FLU A&B, COVID) ARPGX2
Influenza A by PCR: NEGATIVE
Influenza B by PCR: NEGATIVE
SARS Coronavirus 2 by RT PCR: NEGATIVE

## 2020-12-22 LAB — HEMOGLOBIN A1C
Hgb A1c MFr Bld: 7.3 % — ABNORMAL HIGH (ref 4.8–5.6)
Mean Plasma Glucose: 163 mg/dL

## 2020-12-22 LAB — HIV ANTIBODY (ROUTINE TESTING W REFLEX): HIV Screen 4th Generation wRfx: NONREACTIVE

## 2020-12-22 MED ORDER — PREGABALIN 75 MG PO CAPS
75.0000 mg | ORAL_CAPSULE | Freq: Every day | ORAL | Status: DC
  Administered 2020-12-22 – 2020-12-23 (×2): 75 mg via ORAL
  Filled 2020-12-22: qty 1
  Filled 2020-12-22: qty 3

## 2020-12-22 MED ORDER — INFLUENZA VAC A&B SA ADJ QUAD 0.5 ML IM PRSY
0.5000 mL | PREFILLED_SYRINGE | INTRAMUSCULAR | Status: AC
  Administered 2020-12-23: 0.5 mL via INTRAMUSCULAR
  Filled 2020-12-22: qty 0.5

## 2020-12-22 MED ORDER — PREGABALIN 75 MG PO CAPS
300.0000 mg | ORAL_CAPSULE | Freq: Every day | ORAL | Status: DC
  Administered 2020-12-22: 21:00:00 300 mg via ORAL
  Filled 2020-12-22: qty 4

## 2020-12-22 MED ORDER — PREGABALIN 75 MG PO CAPS
150.0000 mg | ORAL_CAPSULE | Freq: Every day | ORAL | Status: DC
  Administered 2020-12-22 – 2020-12-23 (×2): 150 mg via ORAL
  Filled 2020-12-22 (×2): qty 2

## 2020-12-22 MED ORDER — VITAMIN D 25 MCG (1000 UNIT) PO TABS
2000.0000 [IU] | ORAL_TABLET | Freq: Every day | ORAL | Status: DC
  Administered 2020-12-22 – 2020-12-23 (×2): 2000 [IU] via ORAL
  Filled 2020-12-22 (×4): qty 2

## 2020-12-22 MED ORDER — BUTALBITAL-APAP-CAFFEINE 50-325-40 MG PO TABS
1.0000 | ORAL_TABLET | Freq: Four times a day (QID) | ORAL | Status: DC | PRN
  Administered 2020-12-22: 1 via ORAL
  Filled 2020-12-22: qty 1

## 2020-12-22 MED ORDER — OXYCODONE HCL 5 MG PO TABS
10.0000 mg | ORAL_TABLET | Freq: Three times a day (TID) | ORAL | Status: DC | PRN
  Administered 2020-12-22 – 2020-12-23 (×3): 10 mg via ORAL
  Filled 2020-12-22 (×3): qty 2

## 2020-12-22 MED ORDER — HYDROXYCHLOROQUINE SULFATE 200 MG PO TABS
200.0000 mg | ORAL_TABLET | Freq: Every day | ORAL | Status: DC
  Administered 2020-12-22 – 2020-12-23 (×2): 200 mg via ORAL
  Filled 2020-12-22 (×2): qty 1

## 2020-12-22 MED ORDER — ACETAMINOPHEN 325 MG PO TABS: 650.0000 mg | ORAL_TABLET | Freq: Four times a day (QID) | ORAL | Status: DC | PRN

## 2020-12-22 MED ORDER — PANTOPRAZOLE SODIUM 40 MG PO TBEC
40.0000 mg | DELAYED_RELEASE_TABLET | Freq: Every day | ORAL | Status: DC
  Administered 2020-12-22 – 2020-12-23 (×2): 40 mg via ORAL
  Filled 2020-12-22 (×2): qty 1

## 2020-12-22 MED ORDER — ACETAMINOPHEN 325 MG PO TABS
325.0000 mg | ORAL_TABLET | Freq: Three times a day (TID) | ORAL | Status: DC | PRN
  Administered 2020-12-22 – 2020-12-23 (×2): 325 mg via ORAL
  Filled 2020-12-22 (×2): qty 1

## 2020-12-22 MED ORDER — FOLIC ACID 1 MG PO TABS
1.0000 mg | ORAL_TABLET | Freq: Two times a day (BID) | ORAL | Status: DC
  Administered 2020-12-22 – 2020-12-23 (×3): 1 mg via ORAL
  Filled 2020-12-22 (×3): qty 1

## 2020-12-22 MED ORDER — AZITHROMYCIN 500 MG PO TABS
500.0000 mg | ORAL_TABLET | Freq: Every day | ORAL | Status: DC
  Administered 2020-12-22 – 2020-12-23 (×2): 500 mg via ORAL
  Filled 2020-12-22 (×2): qty 1

## 2020-12-22 MED ORDER — BUSPIRONE HCL 10 MG PO TABS
20.0000 mg | ORAL_TABLET | Freq: Three times a day (TID) | ORAL | Status: DC
  Administered 2020-12-22 – 2020-12-23 (×5): 20 mg via ORAL
  Filled 2020-12-22: qty 2
  Filled 2020-12-22: qty 4
  Filled 2020-12-22 (×3): qty 2
  Filled 2020-12-22: qty 4

## 2020-12-22 MED ORDER — SERTRALINE HCL 50 MG PO TABS
100.0000 mg | ORAL_TABLET | Freq: Two times a day (BID) | ORAL | Status: DC
  Administered 2020-12-22 – 2020-12-23 (×3): 100 mg via ORAL
  Filled 2020-12-22 (×3): qty 2

## 2020-12-22 MED ORDER — VITAMIN B-12 1000 MCG PO TABS
1000.0000 ug | ORAL_TABLET | Freq: Every day | ORAL | Status: DC
  Administered 2020-12-22 – 2020-12-23 (×2): 1000 ug via ORAL
  Filled 2020-12-22 (×2): qty 1

## 2020-12-22 MED ORDER — METOPROLOL TARTRATE 25 MG PO TABS
25.0000 mg | ORAL_TABLET | Freq: Two times a day (BID) | ORAL | Status: DC
  Administered 2020-12-22 – 2020-12-23 (×3): 25 mg via ORAL
  Filled 2020-12-22 (×3): qty 1

## 2020-12-22 MED ORDER — SUMATRIPTAN SUCCINATE 50 MG PO TABS
100.0000 mg | ORAL_TABLET | ORAL | Status: DC | PRN
  Administered 2020-12-22: 21:00:00 100 mg via ORAL
  Filled 2020-12-22 (×3): qty 2

## 2020-12-22 MED ORDER — OXYCODONE-ACETAMINOPHEN 10-325 MG PO TABS
1.0000 | ORAL_TABLET | Freq: Three times a day (TID) | ORAL | Status: DC | PRN
Start: 2020-12-22 — End: 2020-12-22

## 2020-12-22 MED ORDER — ATORVASTATIN CALCIUM 20 MG PO TABS
80.0000 mg | ORAL_TABLET | Freq: Every day | ORAL | Status: DC
  Administered 2020-12-22 – 2020-12-23 (×2): 80 mg via ORAL
  Filled 2020-12-22 (×3): qty 4

## 2020-12-22 MED ORDER — CEFTRIAXONE SODIUM 1 G IJ SOLR
1.0000 g | INTRAMUSCULAR | Status: DC
  Administered 2020-12-22: 1 g via INTRAVENOUS
  Filled 2020-12-22 (×2): qty 10

## 2020-12-22 MED ORDER — METHOCARBAMOL 500 MG PO TABS
500.0000 mg | ORAL_TABLET | Freq: Two times a day (BID) | ORAL | Status: DC
  Administered 2020-12-22 – 2020-12-23 (×3): 500 mg via ORAL
  Filled 2020-12-22 (×5): qty 1

## 2020-12-22 MED ORDER — BENZONATATE 100 MG PO CAPS
100.0000 mg | ORAL_CAPSULE | Freq: Three times a day (TID) | ORAL | Status: DC | PRN
  Administered 2020-12-22 (×2): 100 mg via ORAL
  Filled 2020-12-22 (×2): qty 1

## 2020-12-22 MED ORDER — ONDANSETRON HCL 4 MG PO TABS: 4.0000 mg | ORAL_TABLET | Freq: Every day | ORAL | Status: DC | PRN

## 2020-12-22 NOTE — Evaluation (Signed)
Physical Therapy Evaluation Patient Details Name: Cathy Fox MRN: [id number] DOB: May 24, 1955 Today's Date: 12/22/2020  History of Present Illness  65 y.o. female with medical history significant of COPD, diabetes, coagulopathy with antiphospholipid syndrome, obstructive sleep apnea, morbid obesity, depression, GERD, essential hypertension and hyperlipidemia who presents to the ER with shortness of breath cough and hypoxia.  She is not on home O2.  When EMS arrived her oxygen sats was 84% on room air.  She is currently on 2 L of oxygen.  Patient has history of coagulopathy and previous DVTs and PE.  She is on warfarin with INR 1.9.  Patient seen in the ER and evaluated.  She has significant respiratory distress and wheezing.  Also reports some headaches.  Her symptoms have been going on now for 4 days not responding to her home regimen.  She had chest x-rays and CT angiogram checked showing no PE.  Patient appears to be having acute respiratory failure with hypoxia secondary to COPD exacerbation.    Clinical Impression  Pt received seated in recliner upon arrival to room.  Pt agreeable to therapy.  Pt notes she has not been hooked up to telemetry since being moved to ED room.  Pt monitored for HR and SpO2 throughout PT session.  Pt able to navigate hallway and ambulate 140' with good technique and use of the IV pole for support.  Pt notes to become SOB during ambulation on 2L of supplemental O2, however O2 saturations remained >89%.  Pt educated on pursed lip breathing technique and pt was able to make good recovery during standing rest breaks to be >93%.  Pt then transferred back to recliner when transport arrived to take her to admitted room.  Pt will benefit from skilled PT intervention to increase independence and safety with basic mobility in preparation for discharge to the venue listed below.        Recommendations for follow up therapy are one component of a multi-disciplinary discharge  planning process, led by the attending physician.  Recommendations may be updated based on patient status, additional functional criteria and insurance authorization.  Follow Up Recommendations Home health PT    Equipment Recommendations  None recommended by PT    Recommendations for Other Services       Precautions / Restrictions Precautions Precautions: Fall Precaution Comments: low fall risk      Mobility  Bed Mobility               General bed mobility comments: Pt seated in recliner chair    Transfers Overall transfer level: Needs assistance   Transfers: Sit to/from Stand;Stand Pivot Transfers Sit to Stand: Modified independent (Device/Increase time) Stand pivot transfers: Supervision;Min guard          Ambulation/Gait Ambulation/Gait assistance: Min guard Gait Distance (Feet): 140 Feet Assistive device: IV Pole Gait Pattern/deviations: Step-through pattern;Decreased stride length Gait velocity: decreased   General Gait Details: Pt able to ambulate well, but fatigue noted towards the end of therapy.  Stairs            Wheelchair Mobility    Modified Rankin (Stroke Patients Only)       Balance Overall balance assessment: Needs assistance Sitting-balance support: Feet supported Sitting balance-Leahy Scale: Good     Standing balance support: During functional activity Standing balance-Leahy Scale: Fair  Pertinent Vitals/Pain Pain Assessment: No/denies pain    Home Living Family/patient expects to be discharged to:: Private residence Living Arrangements: Children (son lives there but works during the day) Available Help at Discharge: Family;Available PRN/intermittently Type of Home: House Home Access: Stairs to enter Entrance Stairs-Rails: Right Entrance Stairs-Number of Steps: 3 Home Layout: One level Home Equipment: Walker - 2 wheels;Grab bars - tub/shower;Wheelchair - manual;Cane -  single point Additional Comments: She has equipment but does not use    Prior Function Level of Independence: Independent with assistive device(s)               Hand Dominance   Dominant Hand: Right    Extremity/Trunk Assessment   Upper Extremity Assessment Upper Extremity Assessment: Defer to OT evaluation    Lower Extremity Assessment Lower Extremity Assessment: Generalized weakness       Communication   Communication: No difficulties  Cognition Arousal/Alertness: Awake/alert Behavior During Therapy: WFL for tasks assessed/performed Overall Cognitive Status: Within Functional Limits for tasks assessed                                        General Comments      Exercises Other Exercises Other Exercises: Pt educated on roles of PT and services providedd uring hospital stay.  Pt also educated on pursed lip breathing and the benefits of it and ambulation in order to assist the lungs in distributing oxygen to the body.   Assessment/Plan    PT Assessment Patient needs continued PT services  PT Problem List Decreased strength;Decreased activity tolerance       PT Treatment Interventions Gait training;Therapeutic activities;Therapeutic exercise;Balance training;Neuromuscular re-education    PT Goals (Current goals can be found in the Care Plan section)  Acute Rehab PT Goals Patient Stated Goal: to get better and go home PT Goal Formulation: With patient Time For Goal Achievement: 01/05/21 Potential to Achieve Goals: Good    Frequency Min 2X/week   Barriers to discharge        Co-evaluation               AM-PAC PT "6 Clicks" Mobility  Outcome Measure Help needed turning from your back to your side while in a flat bed without using bedrails?: None Help needed moving from lying on your back to sitting on the side of a flat bed without using bedrails?: None Help needed moving to and from a bed to a chair (including a wheelchair)?:  None Help needed standing up from a chair using your arms (e.g., wheelchair or bedside chair)?: None Help needed to walk in hospital room?: A Little Help needed climbing 3-5 steps with a railing? : A Little 6 Click Score: 22    End of Session Equipment Utilized During Treatment: Gait belt Activity Tolerance: Patient tolerated treatment well;Patient limited by fatigue Patient left: in chair;with call bell/phone within reach Nurse Communication: Mobility status PT Visit Diagnosis: Unsteadiness on feet (R26.81);Muscle weakness (generalized) (M62.81)    Time: 2423-5361 PT Time Calculation (min) (ACUTE ONLY): 34 min   Charges:   PT Evaluation $PT Eval Low Complexity: 1 Low PT Treatments $Gait Training: 23-37 mins        Nolon Bussing, PT, DPT 12/22/20, 5:39 PM   Phineas Real 12/22/2020, 5:37 PM

## 2020-12-22 NOTE — Progress Notes (Signed)
1       [place] at [facility]   PATIENT NAME: Cathy Fox    MR#:  [id number]  PCP: Warren Lacy, MD  DATE OF BIRTH:  1956/03/03  SUBJECTIVE:  CHIEF COMPLAINT:   Chief Complaint  Patient presents with  . Shortness of Breath  Patient still feeling quite short of breath hypertension wheezing REVIEW OF SYSTEMS:  Review of Systems  Constitutional:  Negative for diaphoresis, fever, malaise/fatigue and weight loss.  HENT:  Negative for ear discharge, ear pain, hearing loss, nosebleeds, sore throat and tinnitus.   Eyes:  Negative for blurred vision and pain.  Respiratory:  Positive for shortness of breath and wheezing. Negative for cough and hemoptysis.   Cardiovascular:  Negative for chest pain, palpitations, orthopnea and leg swelling.  Gastrointestinal:  Negative for abdominal pain, blood in stool, constipation, diarrhea, heartburn, nausea and vomiting.  Genitourinary:  Negative for dysuria, frequency and urgency.  Musculoskeletal:  Negative for back pain and myalgias.  Skin:  Negative for itching and rash.  Neurological:  Negative for dizziness, tingling, tremors, focal weakness, seizures, weakness and headaches.  Psychiatric/Behavioral:  Negative for depression. The patient is not nervous/anxious.   DRUG ALLERGIES:   Allergies  Allergen Reactions  . Hydrocodone Nausea Only   VITALS:  Blood pressure 120/60, pulse 61, temperature 97.8 F (36.6 C), resp. rate (!) 21, height 5\' 5"  (1.651 m), weight 86.2 kg, SpO2 95 %. PHYSICAL EXAMINATION:  Physical Exam 65 year old female lying in the bed in mild respiratory distress Eyes pupil equal round reactive to light and accommodation, no scleral icterus Neck supple jugulovenous distention, no thyroid enlargement or tenderness Lungs: Expiratory wheezing throughout both lungs.  Rhonchi at the bases bilaterally.  No rales Cardiovascular S1-S2 normal no murmur rales or gallop Abdomen soft, benign Neuro alert and  oriented, nonfocal Skin no rash or lesion Psych normal mood and affect LABORATORY PANEL:  Female CBC Recent Labs  Lab 12/22/20 0650  WBC 5.3  HGB 12.6  HCT 36.2  PLT 182   ------------------------------------------------------------------------------------------------------------------ Chemistries  Recent Labs  Lab 12/21/20 1546 12/22/20 0650  NA 140 140  K 2.7* 3.9  CL 108 105  CO2 24 23  GLUCOSE 127* 187*  BUN 12 10  CREATININE 0.71 0.61  CALCIUM 8.3* 8.4*  MG 1.5*  --   AST  --  19  ALT  --  14  ALKPHOS  --  67  BILITOT  --  0.4   MEDICATIONS:  Scheduled Meds: . atorvastatin  80 mg Oral Daily  . busPIRone  20 mg Oral TID  . cholecalciferol  2,000 Units Oral Daily  . folic acid  1 mg Oral BID  . hydroxychloroquine  200 mg Oral Daily  . insulin aspart  0-20 Units Subcutaneous TID WC  . insulin aspart  0-5 Units Subcutaneous QHS  . ipratropium-albuterol  3 mL Nebulization Q6H  . methocarbamol  500 mg Oral BID  . methylPREDNISolone (SOLU-MEDROL) injection  125 mg Intravenous Q12H   Followed by  . [START ON 12/23/2020] predniSONE  40 mg Oral Q breakfast  . metoprolol tartrate  25 mg Oral BID  . mometasone-formoterol  2 puff Inhalation BID  . pantoprazole  40 mg Oral Daily  . pregabalin  150 mg Oral Q1400  . pregabalin  300 mg Oral QHS  . pregabalin  75 mg Oral Daily  . sertraline  100 mg Oral BID  . cyanocobalamin  1,000 mcg Oral Daily  . warfarin  10  mg Oral Once per day on Sun Tue Thu Fri  . warfarin  5 mg Oral Once per day on Mon Wed Sat  . Warfarin - Pharmacist Dosing Inpatient   Does not apply q1600   Continuous Infusions: . sodium chloride 100 mL/hr at 12/22/20 0158  . cefTRIAXone (ROCEPHIN)  IV     RADIOLOGY:  DG Chest 2 View  Result Date: 12/21/2020 CLINICAL DATA:  Shortness of breath. EXAM: CHEST - 2 VIEW COMPARISON:  05/23/2017 FINDINGS: Coarse lung markings in the lower lungs appear to be chronic. No large areas of airspace disease or  consolidation. Heart size is normal. The trachea is midline. Chronic sclerosis and probable posttraumatic changes in the proximal left humerus. IMPRESSION: Chronic changes without acute findings. Electronically Signed   By: Richarda Overlie M.D.   On: 12/21/2020 16:39   CT Angio Chest PE W and/or Wo Contrast  Result Date: 12/21/2020 CLINICAL DATA:  Short of breath, history of DVT/PE on anticoagulation, hypoxia EXAM: CT ANGIOGRAPHY CHEST WITH CONTRAST TECHNIQUE: Multidetector CT imaging of the chest was performed using the standard protocol during bolus administration of intravenous contrast. Multiplanar CT image reconstructions and MIPs were obtained to evaluate the vascular anatomy. CONTRAST:  OMNIPAQUE IOHEXOL 350 MG/ML SOLN COMPARISON:  12/21/2020, 10/31/2018 FINDINGS: Cardiovascular: This is a technically adequate evaluation of the pulmonary vasculature. No filling defects or pulmonary emboli. The heart is unremarkable without pericardial effusion. Normal caliber of the thoracic aorta. Minimal atherosclerosis of the aortic arch and coronary vasculature. Mediastinum/Nodes: 2.0 cm hypodense nodule right lobe thyroid is unchanged since 2018. No pathologic adenopathy. Trachea and esophagus are unremarkable. Lungs/Pleura: Mild background emphysema. Mild bronchial wall thickening at the lung bases, with scattered ground-glass airspace disease which could reflect early bronchopneumonia. No effusion or pneumothorax. Central airways are patent. Upper Abdomen: No acute abnormality. Musculoskeletal: No acute or destructive bony lesions. Reconstructed images demonstrate no additional findings. Review of the MIP images confirms the above findings. IMPRESSION: 1. No evidence of pulmonary embolus. 2. Mild bibasilar bronchial wall thickening with scattered lower lobe airspace disease. Findings favor early bronchopneumonia. 3.  Aortic Atherosclerosis (ICD10-I70.0). 4. 2 cm hypodense right lobe thyroid nodule. Recommend  thyroid US if not previously performed. Reference: J Am Coll Radiol. 2015 Feb;12(2): 143-50 Electronically Signed   By: Sharlet Salina M.D.   On: 12/21/2020 17:43   ASSESSMENT AND PLAN:  65 year old female with a known history of COPD, diabetes, antiphospholipid syndrome on Coumadin, OSA, morbid obesity, depression, GERD, essential hypertension and hyperlipidemia is admitted for acute hypoxic respiratory failure due to pneumonia and or COPD exacerbation  9/27: Still symptomatic on antibiotics and steroids  Principal Problem:   Acute respiratory failure (HCC) Active Problems:   Pneumonia   Hyponatremia   Hypokalemia   Generalized weakness   Tobacco abuse counseling   Diabetes mellitus type 2, insulin dependent (HCC)   Antiphospholipid antibody syndrome (HCC)   COPD exacerbation (HCC)   Hypomagnesemia  Acute hypoxic respiratory failure due to COPD exacerbation and or pneumonia present on admission requiring 2 L oxygen  Now she is weaned off to room air.  Community-acquired pneumonia Given Rocephin and Zithromax in the ED  COPD exacerbation Continue IV steroids and nebulizer breathing treatment  Hypomagnesemia/hypokalemia Replete and recheck  Antiphospholipid syndrome Continue warfarin.  Pharmacy to manage dosing  Type 2 diabetes Sliding scale while in the hospital  Depression and anxiety Continue Zoloft and BuSpar  Migraine As needed Imitrex  Obesity Body mass index is 31.62 kg/m.  Net  IO Since Admission: 500 mL [12/22/20 1508]      LOS: 1 day      Antibiotics: Rocephin Zithromax  Status is: Inpatient  Remains inpatient appropriate because:Ongoing diagnostic testing needed not appropriate for outpatient work up  Dispo: The patient is from: Home              Anticipated d/c is to: Home              Patient currently is not medically stable to d/c.   Difficult to place patient No   DVT prophylaxis:        warfarin (COUMADIN) tablet 5 mg  warfarin  (COUMADIN) tablet 10 mg     Family Communication:  "discussed with patient")   All the records are reviewed and case discussed with Nursing and TOC team. Management plans discussed with the patient, nursing and they are in agreement.  CODE STATUS: Full Code Level of care: Med-Surg  TOTAL TIME TAKING CARE OF THIS PATIENT: 35 minutes.   More than 50% of the time was spent in counseling/coordination of care: YES  POSSIBLE D/C IN 2-3 DAYS, DEPENDING ON CLINICAL CONDITION.   Delfino Lovett M.D on 12/22/2020 at 3:08 PM  [facility]   CC: Primary care physician; Warren Lacy, MD  Note: This dictation was prepared with Dragon dictation along with smaller phrase technology. Any transcriptional errors that result from this process are unintentional.

## 2020-12-22 NOTE — Progress Notes (Signed)
PT Cancellation Note  Patient Details Name: Cathy Fox MRN: [id number] DOB: Nov 15, 1955   Cancelled Treatment:    Reason Eval/Treat Not Completed: Other (comment).  Pt chart reviewed and attempted to be seen by therapist.  Pt upon arrival noted that she was still fatigued from getting up and going to the restroom down the hallway.  Pt requested to be seen after lunch.  Will re-attempt evaluation as medically appropriate at later date/time.    Nolon Bussing, PT, DPT 12/22/20, 4:35 PM

## 2020-12-22 NOTE — ED Notes (Signed)
Informed RN bed assigned 

## 2020-12-22 NOTE — ED Notes (Signed)
MD Vipul Sherryll Burger messaged: patient complains of "migraine" headache.... requesting medication "stronger than tylenol"

## 2020-12-22 NOTE — Evaluation (Signed)
Occupational Therapy Evaluation Patient Details Name: Cathy Fox MRN: [id number] DOB: 22-Mar-1956 Today's Date: 12/22/2020   History of Present Illness 65 y.o. female with medical history significant of COPD, diabetes, coagulopathy with antiphospholipid syndrome, obstructive sleep apnea, morbid obesity, depression, GERD, essential hypertension and hyperlipidemia who presents to the ER with shortness of breath cough and hypoxia.  She is not on home O2.  When EMS arrived her oxygen sats was 84% on room air.  She is currently on 2 L of oxygen.  Patient has history of coagulopathy and previous DVTs and PE.  She is on warfarin with INR 1.9.  Patient seen in the ER and evaluated.  She has significant respiratory distress and wheezing.  Also reports some headaches.  Her symptoms have been going on now for 4 days not responding to her home regimen.  She had chest x-rays and CT angiogram checked showing no PE.  Patient appears to be having acute respiratory failure with hypoxia secondary to COPD exacerbation.   Clinical Impression   Patient presenting with decreased I in self care, balance, functional mobility/transfers, endurance, and safety awareness.Patient reports living at home with son PTA. She reports complete independence at baseline but does report fatigue with functional tasks. She has AD but does not use. Patient ambulates 100' for toileting needs without use of AD and min guard. Pt able to perform hygiene and clothing management with min guard for balance. OT able to decrease pt's O2 to 2L via [place] and pt remained above 90%.  Patient will benefit from acute OT to increase overall independence in the areas of ADLs, functional mobility, and safety awareness in order to safely discharge to next venue of care.      Recommendations for follow up therapy are one component of a multi-disciplinary discharge planning process, led by the attending physician.  Recommendations may be updated based on patient  status, additional functional criteria and insurance authorization.   Follow Up Recommendations  Home health OT;Supervision - Intermittent    Equipment Recommendations  None recommended by OT       Precautions / Restrictions Precautions Precautions: Fall Precaution Comments: low fall risk      Mobility Bed Mobility               General bed mobility comments: Pt seated in recliner chair    Transfers Overall transfer level: Needs assistance   Transfers: Sit to/from Stand;Stand Pivot Transfers Sit to Stand: Modified independent (Device/Increase time) Stand pivot transfers: Supervision;Min guard            Balance Overall balance assessment: Needs assistance Sitting-balance support: Feet supported Sitting balance-Leahy Scale: Good     Standing balance support: During functional activity Standing balance-Leahy Scale: Fair                             ADL either performed or assessed with clinical judgement   ADL Overall ADL's : Needs assistance/impaired                                       General ADL Comments: supervision - min guard for LB dressing, toileting needs, and functional mobility this session.     Vision Patient Visual Report: No change from baseline              Pertinent Vitals/Pain Pain Assessment: No/denies pain  Hand Dominance Right   Extremity/Trunk Assessment Upper Extremity Assessment Upper Extremity Assessment: LUE deficits/detail;Overall [facility] for tasks assessed;Generalized weakness LUE Deficits / Details: decreased shoulder elevation ~ 90 degrees ( still functional for her with modifications)   Lower Extremity Assessment Lower Extremity Assessment: Overall WFL for tasks assessed       Communication Communication Communication: No difficulties   Cognition Arousal/Alertness: Awake/alert Behavior During Therapy: WFL for tasks assessed/performed Overall Cognitive Status: Within Functional  Limits for tasks assessed                                                Home Living Family/patient expects to be discharged to:: Private residence Living Arrangements: Children (son lives there but works during the day) Available Help at Discharge: Family;Available PRN/intermittently Type of Home: House Home Access: Stairs to enter [employer] of Steps: 3 Entrance Stairs-Rails: Right Home Layout: One level     Bathroom Shower/Tub: Chief Strategy Officer: Standard     Home Equipment: Environmental consultant - 2 wheels;Grab bars - tub/shower;Wheelchair - manual;Cane - single point   Additional Comments: She has equipment but does not use      Prior Functioning/Environment Level of Independence: Independent with assistive device(s)                 OT Problem List: Decreased strength;Decreased activity tolerance;Impaired balance (sitting and/or standing);Decreased safety awareness;Pain;Cardiopulmonary status limiting activity;Decreased knowledge of use of DME or AE;Decreased range of motion;Decreased coordination;Decreased knowledge of precautions      OT Treatment/Interventions: Self-care/ADL training;Manual therapy;Therapeutic exercise;Patient/family education;Balance training;Energy conservation;Therapeutic activities;Cognitive remediation/compensation;DME and/or AE instruction    OT Goals(Current goals can be found in the care plan section) Acute Rehab OT Goals Patient Stated Goal: to get better and go home OT Goal Formulation: With patient Time For Goal Achievement: 01/05/21 Potential to Achieve Goals: Good ADL Goals Pt Will Perform Grooming: with modified independence;standing Pt Will Perform Lower Body Dressing: with modified independence;sit to/from stand Pt Will Transfer to Toilet: with modified independence;ambulating Pt Will Perform Toileting - Clothing Manipulation and hygiene: with modified independence;sit to/from stand  OT  Frequency: Min 2X/week   Barriers to D/C:    none known at this time          AM-PAC OT "6 Clicks" Daily Activity     Outcome Measure Help from another person eating meals?: None Help from another person taking care of personal grooming?: None Help from another person toileting, which includes using toliet, bedpan, or urinal?: A Little Help from another person bathing (including washing, rinsing, drying)?: A Little Help from another person to put on and taking off regular upper body clothing?: None Help from another person to put on and taking off regular lower body clothing?: A Little 6 Click Score: 21   End of Session Nurse Communication: Mobility status;Other (comment) (decreased O2 to 2 L via [place])  Activity Tolerance: Patient tolerated treatment well Patient left: in chair;with call bell/phone within reach;with bed alarm set;with nursing/sitter in room  OT Visit Diagnosis: Unsteadiness on feet (R26.81);Muscle weakness (generalized) (M62.81)                Time: 2094-7096 OT Time Calculation (min): 20 min Charges:  OT General Charges $OT Visit: 1 Visit OT Evaluation $OT Eval Low Complexity: 1 Low OT Treatments $Self Care/Home Management : 8-22 mins  Jackquline Denmark,  MS, OTR/L , CBIS ascom [phone]  12/22/20, 12:41 PM

## 2020-12-22 NOTE — ED Notes (Signed)
Patient is resting comfortably. 

## 2020-12-22 NOTE — Hospital Course (Signed)
65 year old female with a known history of COPD, diabetes, antiphospholipid syndrome on Coumadin, OSA, morbid obesity, depression, GERD, essential hypertension and hyperlipidemia is admitted for acute hypoxic respiratory failure due to pneumonia and or COPD exacerbation  9/27: Still symptomatic on antibiotics and steroids

## 2020-12-22 NOTE — Progress Notes (Signed)
ANTICOAGULATION CONSULT NOTE - Initial Consult  Pharmacy Consult for Warfarin  Indication: Antiphospholipid antibody syndrome  Allergies  Allergen Reactions   Hydrocodone Nausea Only    Patient Measurements: Height: 5\' 5"  (165.1 cm) Weight: 86.2 kg (190 lb 0.6 oz) IBW/kg (Calculated) : 57 Heparin Dosing Weight:   Vital Signs: Temp: 98.9 F (37.2 C) (09/27 1720) Temp Source: Oral (09/27 1720) BP: 138/74 (09/27 1720) Pulse Rate: 82 (09/27 1720)  Labs: Recent Labs    12/21/20 1546 12/21/20 2047 12/22/20 0650  HGB 13.4  --  12.6  HCT 39.4  --  36.2  PLT 188  --  182  LABPROT 22.2*  --  22.5*  INR 1.9*  --  2.0*  CREATININE 0.71  --  0.61  TROPONINIHS 5 4  --      Estimated Creatinine Clearance: 76 mL/min (by C-G formula based on SCr of 0.61 mg/dL).   Medical History: Past Medical History:  Diagnosis Date   Anemia    Anxiety    Arthritis    DDD (degenerative disc disease), cervical    Depression    Diabetes mellitus without complication (HCC)    DVT (deep venous thrombosis) (HCC)    GERD (gastroesophageal reflux disease)    Headache    History of pulmonary embolus (PE) 2005   Hx of antiphospholipid antibody syndrome    Hypotension    Pancreatitis 2015   [facility] [place]   Shortness of breath dyspnea    Sleep apnea    Vertigo     Medications:  Medications Prior to Admission  Medication Sig Dispense Refill Last Dose   atorvastatin (LIPITOR) 80 MG tablet Take 80 mg by mouth daily.   12/20/2020 at 2000   busPIRone (BUSPAR) 10 MG tablet Take 20 mg by mouth 3 (three) times daily.   12/20/2020 at 0800   Cholecalciferol 50 MCG (2000 UT) TABS Take 2,000 Units by mouth daily.   12/20/2020 at 0800   cyanocobalamin 1000 MCG tablet Take 1,000 mcg by mouth daily.   12/20/2020 at 0800   folic acid (FOLVITE) 1 MG tablet Take 1 mg by mouth 2 (two) times daily.    12/20/2020 at 0800   furosemide (LASIX) 20 MG tablet Take 20-40 mg by mouth See admin instructions. Take 2  tablets (40 mg) by mouth every morning and Take 20 mg by mouth in the evening.   12/20/2020 at 0800   hydroxychloroquine (PLAQUENIL) 200 MG tablet Take 200 mg by mouth daily.   12/20/2020 at 0800   metFORMIN (GLUCOPHAGE) 500 MG tablet Take 500 mg by mouth every morning.   12/20/2020 at 0800   methocarbamol (ROBAXIN) 500 MG tablet Take 500 mg by mouth 2 (two) times daily.   12/20/2020 at 0800   metoprolol tartrate (LOPRESSOR) 25 MG tablet Take 25 mg by mouth 2 (two) times daily.   12/20/2020 at 0800   omeprazole (PRILOSEC) 40 MG capsule Take 40 mg by mouth 2 (two) times daily.   12/20/2020 at 0800   oxyCODONE-acetaminophen (PERCOCET) 10-325 MG tablet Take 1 tablet by mouth every 8 (eight) hours as needed for pain.   12/20/2020 at 0800   predniSONE (DELTASONE) 20 MG tablet Take 40 mg by mouth daily.   12/20/2020 at 0800   pregabalin (LYRICA) 150 MG capsule Take 150 mg by mouth 2 (two) times daily.   12/20/2020 at 1400   pregabalin (LYRICA) 75 MG capsule Take 75 mg by mouth daily.   12/20/2020 at 0800   sertraline (  ZOLOFT) 100 MG tablet Take 100 mg by mouth 2 (two) times daily.   12/20/2020 at 0800   SUMAtriptan (IMITREX) 100 MG tablet Take 100 mg by mouth as directed.   12/20/2020 at 0800   warfarin (COUMADIN) 10 MG tablet Take 5-10 mg by mouth See admin instructions. Take5 mg tablet by mouth every evening on Monday, Wednesday, Saturday. Take 10 mg tablet by mouth on Tuesday, Thursday, Friday and Sunday   12/20/2020 at 1600   albuterol (PROVENTIL) (2.5 MG/3ML) 0.083% nebulizer solution Take 3 mLs (2.5 mg total) by nebulization every 6 (six) hours as needed for wheezing or shortness of breath. (Patient not taking: No sig reported) 180 vial 12 Not Taking   alendronate (FOSAMAX) 70 MG tablet Take 70 mg by mouth once a week. Wednesday   12/16/2020   benzonatate (TESSALON) 100 MG capsule Take 100 mg by mouth 3 (three) times daily as needed for cough.   unknown at prn   budesonide-formoterol (SYMBICORT) 160-4.5 MCG/ACT  inhaler Inhale 2 puffs into the lungs 2 (two) times daily. (Patient not taking: No sig reported) 1 Inhaler 12 Not Taking   methotrexate 50 MG/2ML injection Inject 0.8 mLs into the skin once a week. Use on Thursday   12/17/2020   ondansetron (ZOFRAN) 4 MG tablet Take 4 mg by mouth daily as needed for nausea.   unknown at prn   PROAIR HFA 108 (90 Base) MCG/ACT inhaler INHALE 2 PUFFS PO Q 6 TO 8 H PRN FOR WHEEZING  10 unknown at prn    Assessment: Pharmacy consulted to dose warfarin in this 65 year old female with antiphospholipid antibody syndrome.   Pt was on warfarin 5 mg PO every Mon, Wed and Sat   and warfarin 10 mg PO every Tues, Thurs, Frid and Sun. INR therapeutic but new Ddi with antibiotics noted.  9/26 INR @ 1546  1.9  ;warfarin 2.5mg  given on admission 9/27 INR @ 0746  2.0 ; 10mg   Goal of Therapy:  INR 2-3   Plan:  Resume warfarin home dose and continue close monitoring Will recheck INR daily x 3 days and adjust warfarin as needed  Michaila Kenney Rodriguez-Guzman PharmD, BCPS 12/22/2020 5:36 PM

## 2020-12-23 DIAGNOSIS — J9601 Acute respiratory failure with hypoxia: Secondary | ICD-10-CM | POA: Diagnosis not present

## 2020-12-23 DIAGNOSIS — J189 Pneumonia, unspecified organism: Secondary | ICD-10-CM | POA: Diagnosis not present

## 2020-12-23 DIAGNOSIS — E041 Nontoxic single thyroid nodule: Secondary | ICD-10-CM | POA: Diagnosis present

## 2020-12-23 DIAGNOSIS — D6861 Antiphospholipid syndrome: Secondary | ICD-10-CM | POA: Diagnosis not present

## 2020-12-23 DIAGNOSIS — J441 Chronic obstructive pulmonary disease with (acute) exacerbation: Secondary | ICD-10-CM | POA: Diagnosis not present

## 2020-12-23 LAB — PROTIME-INR
INR: 2.5 — ABNORMAL HIGH (ref 0.8–1.2)
Prothrombin Time: 26.6 seconds — ABNORMAL HIGH (ref 11.4–15.2)

## 2020-12-23 LAB — BASIC METABOLIC PANEL
Anion gap: 9 (ref 5–15)
BUN: 12 mg/dL (ref 8–23)
CO2: 23 mmol/L (ref 22–32)
Calcium: 9.1 mg/dL (ref 8.9–10.3)
Chloride: 107 mmol/L (ref 98–111)
Creatinine, Ser: 0.57 mg/dL (ref 0.44–1.00)
GFR, Estimated: >60 mL/min (ref >60)
Glucose, Bld: 212 mg/dL — ABNORMAL HIGH (ref 70–99)
Potassium: 4.4 mmol/L (ref 3.5–5.1)
Sodium: 139 mmol/L (ref 135–145)

## 2020-12-23 LAB — CBC
HCT: 39.2 % (ref 36.0–46.0)
Hemoglobin: 12.6 g/dL (ref 12.0–15.0)
MCH: 29.3 pg (ref 26.0–34.0)
MCHC: 32.1 g/dL (ref 30.0–36.0)
MCV: 91.2 fL (ref 80.0–100.0)
Platelets: 174 10*3/uL (ref 150–400)
RBC: 4.3 MIL/uL (ref 3.87–5.11)
RDW: 17.4 % — ABNORMAL HIGH (ref 11.5–15.5)
WBC: 8.2 10*3/uL (ref 4.0–10.5)
nRBC: 0 % (ref 0.0–0.2)

## 2020-12-23 LAB — GLUCOSE, CAPILLARY
Glucose-Capillary: 155 mg/dL — ABNORMAL HIGH (ref 70–99)
Glucose-Capillary: 74 mg/dL (ref 70–99)

## 2020-12-23 MED ORDER — BUDESONIDE-FORMOTEROL FUMARATE 160-4.5 MCG/ACT IN AERO: 2.0000 | INHALATION_SPRAY | Freq: Two times a day (BID) | RESPIRATORY_TRACT | 12 refills | Status: AC

## 2020-12-23 MED ORDER — AZITHROMYCIN 500 MG PO TABS: 500.0000 mg | ORAL_TABLET | Freq: Every day | ORAL | 0 refills | Status: AC

## 2020-12-23 MED ORDER — PREDNISONE 10 MG PO TABS: ORAL_TABLET | ORAL | 0 refills | Status: AC

## 2020-12-23 MED ORDER — ADULT MULTIVITAMIN W/MINERALS CH
1.0000 | ORAL_TABLET | Freq: Every day | ORAL | Status: DC
  Administered 2020-12-23: 13:00:00 1 via ORAL
  Filled 2020-12-23: qty 1

## 2020-12-23 MED ORDER — ALBUTEROL SULFATE (2.5 MG/3ML) 0.083% IN NEBU: 2.5000 mg | INHALATION_SOLUTION | RESPIRATORY_TRACT | 12 refills | Status: AC | PRN

## 2020-12-23 MED ORDER — HYDROCORTISONE 1 % EX LOTN: 1.0000 "application " | TOPICAL_LOTION | Freq: Two times a day (BID) | CUTANEOUS | 0 refills | Status: AC

## 2020-12-23 MED ORDER — AMOXICILLIN-POT CLAVULANATE 875-125 MG PO TABS: 1.0000 | ORAL_TABLET | Freq: Two times a day (BID) | ORAL | 0 refills | Status: AC

## 2020-12-23 MED ORDER — BENZONATATE 100 MG PO CAPS: 100.0000 mg | ORAL_CAPSULE | Freq: Three times a day (TID) | ORAL | 0 refills | Status: AC | PRN

## 2020-12-23 MED ORDER — WARFARIN SODIUM 2.5 MG PO TABS
2.5000 mg | ORAL_TABLET | Freq: Once | ORAL | Status: AC
  Administered 2020-12-23: 16:00:00 2.5 mg via ORAL
  Filled 2020-12-23: qty 1

## 2020-12-23 MED ORDER — GLUCERNA SHAKE PO LIQD
237.0000 mL | Freq: Three times a day (TID) | ORAL | Status: DC
  Administered 2020-12-23: 237 mL via ORAL

## 2020-12-23 NOTE — TOC Progression Note (Signed)
Transition of Care [facility]) - Progression Note    Patient Details  Name: PAQUITA PRINTY MRN: [id number] Date of Birth: 1955-06-13  [facility] CM/SW Contact  Caryn Section, RN Phone Number: 12/23/2020, 2:04 PM  Clinical Narrative:   Barbara Cower from [facility] confirmed that they can provide home care for patient.  Patient requested home nebulizer, MD ordered, [employer] notified.    Expected Discharge Plan: Home w Home Health Services Barriers to Discharge: Continued Medical Work up  Expected Discharge Plan and Services Expected Discharge Plan: Home w Home Health Services In-house Referral: Chaplain Discharge Planning Services: CM Consult Post Acute Care Choice: Home Health Living arrangements for the past 2 months: Single Family Home Expected Discharge Date: 12/23/20               DME Arranged:  (No DME recommended at this time)         HH Arranged: PT, OT HH Agency: [facility] Date HH Agency Contacted: 12/23/20 Time HH Agency Contacted: 0930 Representative spoke with at [facility] Agency: Barbara Cower   Social Determinants of Health (SDOH) Interventions    Readmission Risk Interventions No flowsheet data found.

## 2020-12-23 NOTE — Progress Notes (Signed)
Occupational Therapy Treatment Patient Details Name: Cathy Fox MRN: [id number] DOB: [birth year] Today's Date: 12/23/2020   History of present illness 65 y.o. female with medical history significant of COPD, diabetes, coagulopathy with antiphospholipid syndrome, obstructive sleep apnea, morbid obesity, depression, GERD, essential hypertension and hyperlipidemia who presents to the ER with shortness of breath cough and hypoxia.  She is not on home O2.  When EMS arrived her oxygen sats was 84% on room air.  She is currently on 2 L of oxygen.  Patient has history of coagulopathy and previous DVTs and PE.  She is on warfarin with INR 1.9.  Patient seen in the ER and evaluated.  She has significant respiratory distress and wheezing.  Also reports some headaches.  Her symptoms have been going on now for 4 days not responding to her home regimen.  She had chest x-rays and CT angiogram checked showing no PE.  Patient appears to be having acute respiratory failure with hypoxia secondary to COPD exacerbation.   OT comments  Ms. Pepperman was seen for OT tx this date. She is received seated in room recliner with son present at bedside. Pt denies pain and is agreeable to OT tx. Pt able to complete standing grooming tasks at sink with supervision and min cueing for use of energy conservation strategies. Pt educated on falls prevention strategies and energy conservation techniques to maximize safety and functional independence during ADL management at home. She return demos excellent understanding of education provided during session. Pt is making excellent progress toward OT goals and continues to benefit from skilled OT services while acutely hospitalized. Will continue to follow POC as written. Frequency and DC recommendations remain appropriate.    Recommendations for follow up therapy are one component of a multi-disciplinary discharge planning process, led by the attending physician.  Recommendations may be updated  based on patient status, additional functional criteria and insurance authorization.    Follow Up Recommendations  Home health OT;Supervision - Intermittent    Equipment Recommendations  None recommended by OT    Recommendations for Other Services      Precautions / Restrictions Precautions Precautions: Fall Precaution Comments: low fall risk Restrictions Weight Bearing Restrictions: No       Mobility Bed Mobility               General bed mobility comments: In recliner pre/post session    Transfers Overall transfer level: Needs assistance Equipment used: None Transfers: Sit to/from Stand Sit to Stand: Modified independent (Device/Increase time) Stand pivot transfers: Modified independent (Device/Increase time)       General transfer comment: Occasional use of IV pole, but generally pt able to maintain standing balance independently.    Balance Overall balance assessment: Needs assistance Sitting-balance support: Feet supported Sitting balance-Leahy Scale: Normal     Standing balance support: No upper extremity supported Standing balance-Leahy Scale: Good                             ADL either performed or assessed with clinical judgement   ADL Overall ADL's : Needs assistance/impaired                                       General ADL Comments: Pt able to complete standing grooming tasks at sink with supervision and min cueing for use of energy conservation strategies. Pt educated  on falls prevention strategies and energy conservation techniques to maximize safety and functional independence during ADL management at home.     Vision Baseline Vision/History: 1 Wears glasses Patient Visual Report: No change from baseline     Perception     Praxis      Cognition Arousal/Alertness: Awake/alert Behavior During Therapy: WFL for tasks assessed/performed Overall Cognitive Status: Within Functional Limits for tasks assessed                                  General Comments: pt is A and O x 4        Exercises Other Exercises Other Exercises: OT facilitates standing grooming tasks at sink with education on energy conservation strategies and falls prevention strategies t/o session. Pt return demonstrates excellent recall and carryover of education during functional tasks.   Shoulder Instructions       General Comments Pt vitals monitored t/o session and remain WFL with SpO2 >/= 93%, HR in 70's.    Pertinent Vitals/ Pain       Pain Assessment: No/denies pain  Home Living                                          Prior Functioning/Environment              Frequency  Min 2X/week        Progress Toward Goals  OT Goals(current goals can now be found in the care plan section)  Progress towards OT goals: Progressing toward goals  Acute Rehab OT Goals Patient Stated Goal: to get better and go home OT Goal Formulation: With patient Time For Goal Achievement: 01/05/21 Potential to Achieve Goals: Good  Plan Discharge plan remains appropriate;Frequency remains appropriate    Co-evaluation                 AM-PAC OT "6 Clicks" Daily Activity     Outcome Measure   Help from another person eating meals?: None Help from another person taking care of personal grooming?: None Help from another person toileting, which includes using toliet, bedpan, or urinal?: A Little Help from another person bathing (including washing, rinsing, drying)?: A Little Help from another person to put on and taking off regular upper body clothing?: None Help from another person to put on and taking off regular lower body clothing?: A Little 6 Click Score: 21    End of Session Equipment Utilized During Treatment: Gait belt;Rolling walker  OT Visit Diagnosis: Unsteadiness on feet (R26.81);Muscle weakness (generalized) (M62.81)   Activity Tolerance Patient tolerated treatment well    Patient Left in chair;with call bell/phone within reach;with nursing/sitter in room   Nurse Communication          Time: 6962-9528 OT Time Calculation (min): 23 min  Charges: OT General Charges $OT Visit: 1 Visit OT Treatments $Self Care/Home Management : 23-37 mins  Rockney Ghee, M.S., OTR/L Ascom: [phone] 12/23/20, 3:55 PM

## 2020-12-23 NOTE — Discharge Instructions (Signed)
Multivitamins at [facility]: [facility] Multivitamins for Women 50+ ($8.50 for 65 count) OR Centrum Silver Multivitamin ($9.50 for 65 count)

## 2020-12-23 NOTE — Discharge Summary (Signed)
Physician Discharge Summary  Cathy Fox IRW:[id number] DOB: 1955/08/06 DOA: 12/21/2020  PCP: Warren Lacy, MD  Admit date: 12/21/2020 Discharge date: 12/23/2020  Admitted From: Home Disposition: Home  Recommendations for Outpatient Follow-up:  Follow up with PCP in 1-2 weeks Please obtain BMP/CBC in one week Patient will need thyroid ultrasound to evaluate right thyroid nodule  Home Health: Home health PT, OT Equipment/Devices: Nebulizer machine  Discharge Condition: Stable CODE STATUS: Full code Diet recommendation: Heart healthy, carb modified  Brief/Interim Summary: 65 year old female with a history of COPD, diabetes, antiphospholipid antibody syndrome, rheumatoid arthritis, admitted to the hospital with COPD exacerbation and acute hypoxic respiratory failure.  She was started on antibiotics, steroids and bronchodilators.  Overall respiratory status has improved.  Feels that her breathing is back to baseline.  She is currently on room air.  She is been transitioned to prednisone taper.  She will complete antibiotics to treat any underlying pneumonia.  Discharge Diagnoses:  Principal Problem:   Acute respiratory failure (HCC) Active Problems:   Pneumonia   Hyponatremia   Hypokalemia   Generalized weakness   Tobacco abuse counseling   Diabetes mellitus type 2, insulin dependent (HCC)   Antiphospholipid antibody syndrome (HCC)   COPD exacerbation (HCC)   Hypomagnesemia   Right thyroid nodule  Acute hypoxic respiratory failure due to COPD exacerbation and or pneumonia present on admission requiring 2 L oxygen  Now she is weaned off to room air and is breathing comfortably.   Community-acquired pneumonia Given Rocephin and Zithromax in the ED She has been transitioned to Augmentin and Zithromax to complete her antibiotic course   COPD exacerbation Treated with IV steroids and nebulizer breathing treatment -She has been transitioned to prednisone taper,  continued on Symbicort and mucolytic's   Hypomagnesemia/hypokalemia Replaced   Antiphospholipid syndrome Continue warfarin.     Type 2 diabetes Sliding scale while in the hospital -Resume home regimen on discharge   Depression and anxiety Continue Zoloft and BuSpar   Migraine As needed Imitrex  Discharge Instructions  Discharge Instructions     Diet - low sodium heart healthy   Complete by: As directed    Increase activity slowly   Complete by: As directed       Allergies as of 12/23/2020       Reactions   Hydrocodone Nausea Only        Medication List     STOP taking these medications    oxyCODONE-acetaminophen 10-325 MG tablet Commonly known as: PERCOCET       TAKE these medications    alendronate 70 MG tablet Commonly known as: FOSAMAX Take 70 mg by mouth once a week. Wednesday   amoxicillin-clavulanate 875-125 MG tablet Commonly known as: Augmentin Take 1 tablet by mouth 2 (two) times daily for 7 days.   atorvastatin 80 MG tablet Commonly known as: LIPITOR Take 80 mg by mouth daily.   azithromycin 500 MG tablet Commonly known as: ZITHROMAX Take 1 tablet (500 mg total) by mouth daily. Start taking on: December 24, 2020   benzonatate 100 MG capsule Commonly known as: TESSALON Take 1 capsule (100 mg total) by mouth 3 (three) times daily as needed for cough.   budesonide-formoterol 160-4.5 MCG/ACT inhaler Commonly known as: Symbicort Inhale 2 puffs into the lungs 2 (two) times daily.   busPIRone 10 MG tablet Commonly known as: BUSPAR Take 20 mg by mouth 3 (three) times daily.   Cholecalciferol 50 MCG (2000 UT) Tabs Take 2,000 Units by mouth daily.  cyanocobalamin 1000 MCG tablet Take 1,000 mcg by mouth daily.   folic acid 1 MG tablet Commonly known as: FOLVITE Take 1 mg by mouth 2 (two) times daily.   furosemide 20 MG tablet Commonly known as: LASIX Take 20-40 mg by mouth See admin instructions. Take 2 tablets (40 mg) by  mouth every morning and Take 20 mg by mouth in the evening.   hydrocortisone 1 % lotion Apply 1 application topically 2 (two) times daily.   hydroxychloroquine 200 MG tablet Commonly known as: PLAQUENIL Take 200 mg by mouth daily.   metFORMIN 500 MG tablet Commonly known as: GLUCOPHAGE Take 500 mg by mouth every morning.   methocarbamol 500 MG tablet Commonly known as: ROBAXIN Take 500 mg by mouth 2 (two) times daily.   methotrexate 50 MG/2ML injection Inject 0.8 mLs into the skin once a week. Use on Thursday   metoprolol tartrate 25 MG tablet Commonly known as: LOPRESSOR Take 25 mg by mouth 2 (two) times daily.   omeprazole 40 MG capsule Commonly known as: PRILOSEC Take 40 mg by mouth 2 (two) times daily.   ondansetron 4 MG tablet Commonly known as: ZOFRAN Take 4 mg by mouth daily as needed for nausea.   predniSONE 10 MG tablet Commonly known as: DELTASONE Take  po daily for 1 day and decrease by  every day until taking  daily What changed:  medication strength how much to take how to take this when to take this additional instructions   pregabalin 150 MG capsule Commonly known as: LYRICA Take 150 mg by mouth 2 (two) times daily.   pregabalin 75 MG capsule Commonly known as: LYRICA Take 75 mg by mouth daily.   ProAir HFA 108 (90 Base) MCG/ACT inhaler Generic drug: albuterol INHALE 2 PUFFS PO Q 6 TO 8 H PRN FOR WHEEZING What changed: Another medication with the same name was changed. Make sure you understand how and when to take each.   albuterol (2.5 MG/3ML) 0.083% nebulizer solution Commonly known as: PROVENTIL Take 3 mLs (2.5 mg total) by nebulization every 4 (four) hours as needed for shortness of breath. What changed:  when to take this reasons to take this   sertraline 100 MG tablet Commonly known as: ZOLOFT Take 100 mg by mouth 2 (two) times daily.   SUMAtriptan 100 MG tablet Commonly known as: IMITREX Take 100 mg by mouth as  directed.   warfarin 10 MG tablet Commonly known as: COUMADIN Take 5-10 mg by mouth See admin instructions. Take5 mg tablet by mouth every evening on Monday, Wednesday, Saturday. Take 10 mg tablet by mouth on Tuesday, Thursday, Friday and /28/22 1344            Allergies  Allergen Reactions  Hydrocodone Nausea Only    Consultations:    Procedures/Studies: DG Chest 2 View  Result Date: 12/21/2020 CLINICAL DATA:  Shortness of breath. EXAM: CHEST - 2 VIEW COMPARISON:  05/23/2017 FINDINGS: Coarse lung markings in the lower lungs appear to be chronic. No large areas of airspace disease or consolidation. Heart size is normal. The trachea is midline. Chronic sclerosis and probable posttraumatic changes in the proximal left humerus. IMPRESSION: Chronic changes without acute findings. Electronically Signed   By: Richarda Overlie M.D.   On: 12/21/2020 16:39   CT Angio Chest PE W and/or Wo Contrast  Result Date: 12/21/2020 CLINICAL DATA:  Short of breath, history of DVT/PE on anticoagulation, hypoxia EXAM: CT ANGIOGRAPHY CHEST WITH CONTRAST TECHNIQUE: Multidetector CT imaging of the chest was performed using the standard protocol during bolus administration of intravenous contrast. Multiplanar CT image reconstructions and MIPs were obtained to evaluate the vascular anatomy. CONTRAST:  OMNIPAQUE IOHEXOL 350 MG/ML SOLN COMPARISON:  12/21/2020, 10/31/2018 FINDINGS: Cardiovascular: This  is a technically adequate evaluation of the pulmonary vasculature. No filling defects or pulmonary emboli. The heart is unremarkable without pericardial effusion. Normal caliber of the thoracic aorta. Minimal atherosclerosis of the aortic arch and coronary vasculature. Mediastinum/Nodes: 2.0 cm hypodense nodule right lobe thyroid is unchanged since 2018. No pathologic adenopathy. Trachea and esophagus are unremarkable. Lungs/Pleura: Mild background emphysema. Mild bronchial wall thickening at the lung bases, with scattered ground-glass airspace disease which could reflect early bronchopneumonia. No effusion or pneumothorax. Central airways are patent. Upper Abdomen: No acute abnormality. Musculoskeletal: No acute or destructive bony lesions. Reconstructed images demonstrate no additional findings. Review of the MIP images confirms the above findings. IMPRESSION: 1. No evidence of pulmonary embolus. 2. Mild bibasilar bronchial wall thickening with scattered lower lobe airspace disease. Findings favor early bronchopneumonia. 3.  Aortic Atherosclerosis (ICD10-I70.0). 4. 2 cm hypodense right lobe thyroid nodule. Recommend thyroid US if not previously performed. Reference: J Am Coll Radiol. 2015 Feb;12(2): 143-50 Electronically Signed   By: Sharlet Salina M.D.   On: 12/21/2020 17:43      Subjective: Patient is feeling better.  She has been off oxygen.  Able to ambulate in the hall without difficulty.  Discharge Exam: Vitals:   12/23/20 0754 12/23/20 0907 12/23/20 1147 12/23/20 1542  BP: 140/66  125/68 129/90  Pulse: (!) 59 65 65 70  Resp: 18  16 17   Temp: 97.9 F (36.6 C)  97.7 F (36.5 C) 99 F (37.2 C)  TempSrc: Oral  Oral   SpO2: 100% 98% 96% 95%  Weight:      Height:        General: Pt is alert, awake, not in acute distress Cardiovascular: RRR, S1/S2 +, no rubs, no gallops Respiratory: CTA bilaterally, no wheezing, no rhonchi Abdominal: Soft, NT, ND, bowel sounds + Extremities: no edema, no  cyanosis    The results of significant diagnostics from this hospitalization (including imaging, microbiology, ancillary and laboratory) are listed below for reference.     Microbiology: Recent Results (from the past 240 hour(s))  Blood culture (routine x 2)     Status: None (Preliminary result)   Collection Time: 12/21/20  8:47 PM   Specimen: BLOOD  Result Value Ref Range Status   Specimen Description BLOOD BLOOD RIGHT FOREARM  Final   Special Requests   Final    BOTTLES DRAWN AEROBIC AND ANAEROBIC Blood Culture adequate volume   Culture   Final    NO GROWTH 2 DAYS Performed at [facility], [address], [place], [place] [address]    Report Status PENDING  Incomplete  Blood culture (routine x 2)     Status: None (Preliminary result)   Collection Time: 12/21/20  8:47 PM   Specimen: BLOOD  Result Value Ref Range Status   Specimen Description BLOOD LEFT ANTECUBITAL  Final   Special Requests   Final    BOTTLES DRAWN AEROBIC AND ANAEROBIC Blood Culture adequate volume   Culture   Final    NO GROWTH 2 DAYS Performed at [facility], [address] [place], [place] [address]    Report Status PENDING  Incomplete  Resp Panel by RT-PCR (Flu A&B, Covid) Nasopharyngeal Swab     Status: None   Collection Time: 12/22/20  9:50 AM   Specimen: Nasopharyngeal Swab; Nasopharyngeal(NP) swabs in vial transport medium  Result Value Ref Range Status   SARS Coronavirus 2 by RT PCR NEGATIVE NEGATIVE Final    Comment: (NOTE) SARS-CoV-2 target nucleic acids are NOT DETECTED.  The SARS-CoV-2 RNA is generally detectable in upper respiratory specimens during the acute phase of infection. The lowest concentration of SARS-CoV-2 viral copies this assay can detect is 138 copies/mL. A negative result does not preclude SARS-Cov-2 infection and should not be used as the sole basis for treatment or other patient management decisions. A negative result may occur with  improper  specimen collection/handling, submission of specimen other than nasopharyngeal swab, presence of viral mutation(s) within the areas targeted by this assay, and inadequate number of viral copies(<138 copies/mL). A negative result must be combined with clinical observations, patient history, and epidemiological information. The expected result is Negative.  Fact Sheet for Patients:  BloggerCourse.com  Fact Sheet for Healthcare Providers:  SeriousBroker.it  This test is no t yet approved or cleared by the [place] FDA and  has been authorized for detection and/or diagnosis of SARS-CoV-2 by FDA under an Emergency Use Authorization (EUA). This EUA will remain  in effect (meaning this test can be used) for the duration of the COVID-19 declaration under Section 564(b)(1) of the Act, 21 U.S.C.section 360bbb-3(b)(1), unless the authorization is terminated  or revoked sooner.       Influenza A by PCR NEGATIVE NEGATIVE Final   Influenza B by PCR NEGATIVE NEGATIVE Final    Comment: (NOTE) The Xpert Xpress SARS-CoV-2/FLU/RSV plus assay is intended as an aid in the diagnosis of influenza from Nasopharyngeal swab specimens and should not be used as a sole basis for treatment. Nasal washings and aspirates are unacceptable for Xpert Xpress SARS-CoV-2/FLU/RSV testing.  Fact Sheet for Patients: BloggerCourse.com  Fact Sheet for Healthcare Providers: SeriousBroker.it  This test is not yet approved or cleared by the [place] FDA and has been authorized for detection and/or diagnosis of SARS-CoV-2 by FDA under an Emergency Use Authorization (EUA). This EUA will remain in effect (meaning this test can be used) for the duration of the COVID-19 declaration under Section 564(b)(1) of the Act, 21 U.S.C. section 360bbb-3(b)(1), unless the authorization is terminated or revoked.  Performed at  [facility], [address] [place], [place] [address]      Labs: BNP (last 3 results) Recent Labs    12/21/20 1546  BNP 57.3   Basic Metabolic Panel: Recent Labs  Lab 12/21/20 1546 12/22/20 0650 12/23/20 0442  NA 140 140 139  K 2.7* 3.9 4.4  CL 108 105 107  CO2 24 23 23   GLUCOSE 127* 187* 212*  BUN 12 10 12  CREATININE 0.71 0.61 0.57  CALCIUM 8.3* 8.4* 9.1  MG 1.5*  --   --    Liver Function Tests: Recent Labs  Lab 12/22/20 0650  AST 19  ALT 14  ALKPHOS 67  BILITOT 0.4  PROT 6.7  ALBUMIN 3.6   No results for input(s): LIPASE, AMYLASE in the last 168 hours. No results for input(s): AMMONIA in the last 168 hours. CBC: Recent Labs  Lab 12/21/20 1546 12/22/20 0650 12/23/20 0442  WBC 6.6 5.3 8.2  NEUTROABS  --  4.6  --   HGB 13.4 12.6 12.6  HCT 39.4 36.2 39.2  MCV 86.2 88.7 91.2  PLT 188 182 174   Cardiac Enzymes: No results for input(s): CKTOTAL, CKMB, CKMBINDEX, TROPONINI in the last 168 hours. BNP: Invalid input(s): POCBNP CBG: Recent Labs  Lab 12/22/20 1243 12/22/20 1726 12/22/20 2106 12/23/20 0754 12/23/20 1145  GLUCAP 147* 178* 116* 155* 74   D-Dimer No results for input(s): DDIMER in the last 72 hours. Hgb A1c Recent Labs    12/22/20 0650  HGBA1C 7.3*   Lipid Profile No results for input(s): CHOL, HDL, LDLCALC, TRIG, CHOLHDL, LDLDIRECT in the last 72 hours. Thyroid function studies No results for input(s): TSH, T4TOTAL, T3FREE, THYROIDAB in the last 72 hours.  Invalid input(s): FREET3 Anemia work up No results for input(s): VITAMINB12, FOLATE, FERRITIN, TIBC, IRON, RETICCTPCT in the last 72 hours. Urinalysis    Component Value Date/Time   COLORURINE YELLOW (A) 09/16/2016 0445   APPEARANCEUR CLOUDY (A) 09/16/2016 0445   LABSPEC 1.014 09/16/2016 0445   PHURINE 5.0 09/16/2016 0445   GLUCOSEU NEGATIVE 09/16/2016 0445   HGBUR SMALL (A) 09/16/2016 0445   BILIRUBINUR NEGATIVE 09/16/2016 0445   KETONESUR NEGATIVE  09/16/2016 0445   PROTEINUR NEGATIVE 09/16/2016 0445   NITRITE NEGATIVE 09/16/2016 0445   LEUKOCYTESUR LARGE (A) 09/16/2016 0445   Sepsis Labs Invalid input(s): PROCALCITONIN,  WBC,  LACTICIDVEN Microbiology Recent Results (from the past 240 hour(s))  Blood culture (routine x 2)     Status: None (Preliminary result)   Collection Time: 12/21/20  8:47 PM   Specimen: BLOOD  Result Value Ref Range Status   Specimen Description BLOOD BLOOD RIGHT FOREARM  Final   Special Requests   Final    BOTTLES DRAWN AEROBIC AND ANAEROBIC Blood Culture adequate volume   Culture   Final    NO GROWTH 2 DAYS Performed at [facility], [address] [place], [place] [address]    Report Status PENDING  Incomplete  Blood culture (routine x 2)     Status: None (Preliminary result)   Collection Time: 12/21/20  8:47 PM   Specimen: BLOOD  Result Value Ref Range Status   Specimen Description BLOOD LEFT ANTECUBITAL  Final   Special Requests   Final    BOTTLES DRAWN AEROBIC AND ANAEROBIC Blood Culture adequate volume   Culture   Final    NO GROWTH 2 DAYS Performed at [facility], [address] [place], [place] [address]    Report Status PENDING  Incomplete  Resp Panel by RT-PCR (Flu A&B, Covid) Nasopharyngeal Swab     Status: None   Collection Time: 12/22/20  9:50 AM   Specimen: Nasopharyngeal Swab; Nasopharyngeal(NP) swabs in vial transport medium  Result Value Ref Range Status   SARS Coronavirus 2 by RT PCR NEGATIVE NEGATIVE Final    Comment: (NOTE) SARS-CoV-2 target nucleic acids are NOT DETECTED.  The SARS-CoV-2 RNA is generally detectable in upper respiratory specimens during  the acute phase of infection. The lowest concentration of SARS-CoV-2 viral copies this assay can detect is 138 copies/mL. A negative result does not preclude SARS-Cov-2 infection and should not be used as the sole basis for treatment or other patient management decisions. A negative result may occur  with  improper specimen collection/handling, submission of specimen other than nasopharyngeal swab, presence of viral mutation(s) within the areas targeted by this assay, and inadequate number of viral copies(<138 copies/mL). A negative result must be combined with clinical observations, patient history, and epidemiological information. The expected result is Negative.  Fact Sheet for Patients:  BloggerCourse.com  Fact Sheet for Healthcare Providers:  SeriousBroker.it  This test is no t yet approved or cleared by the [place] FDA and  has been authorized for detection and/or diagnosis of SARS-CoV-2 by FDA under an Emergency Use Authorization (EUA). This EUA will remain  in effect (meaning this test can be used) for the duration of the COVID-19 declaration under Section 564(b)(1) of the Act, 21 U.S.C.section 360bbb-3(b)(1), unless the authorization is terminated  or revoked sooner.       Influenza A by PCR NEGATIVE NEGATIVE Final   Influenza B by PCR NEGATIVE NEGATIVE Final    Comment: (NOTE) The Xpert Xpress SARS-CoV-2/FLU/RSV plus assay is intended as an aid in the diagnosis of influenza from Nasopharyngeal swab specimens and should not be used as a sole basis for treatment. Nasal washings and aspirates are unacceptable for Xpert Xpress SARS-CoV-2/FLU/RSV testing.  Fact Sheet for Patients: BloggerCourse.com  Fact Sheet for Healthcare Providers: SeriousBroker.it  This test is not yet approved or cleared by the [place] FDA and has been authorized for detection and/or diagnosis of SARS-CoV-2 by FDA under an Emergency Use Authorization (EUA). This EUA will remain in effect (meaning this test can be used) for the duration of the COVID-19 declaration under Section 564(b)(1) of the Act, 21 U.S.C. section 360bbb-3(b)(1), unless the authorization is terminated  or revoked.  Performed at [facility], [address] [place], [place] [address]      Time coordinating discharge:  SIGNED:   Erick Blinks, MD  [facility] 12/23/2020, 8:21 PM   If 7PM-7AM, please contact night-coverage [email or web address]

## 2020-12-23 NOTE — Progress Notes (Signed)
Initial Nutrition Assessment  DOCUMENTATION CODES:  Obesity unspecified  INTERVENTION:  Add Glucerna Shake po TID, each supplement provides 220 kcal and 10 grams of protein.  Add MVI with minerals daily.  Obtain updated weight.  NUTRITION DIAGNOSIS:  Inadequate oral intake related to social / environmental circumstances (low-income status) as evidenced by per patient/family report.  GOAL:  Patient will meet greater than or equal to 90% of their needs  MONITOR:  PO intake, Supplement acceptance, Labs, Weight trends, I & O's  REASON FOR ASSESSMENT:  Consult Assessment of nutrition requirement/status  ASSESSMENT:  65 yo female with a PMH of COPD, T2DM, antiphospholipid syndrome on Coumadin, depression, GERD, essential hypertension, and hyperlipidemia is admitted for acute hypoxic respiratory failure due to PNA and/or COPD exacerbation.  Spoke with pt at bedside. Pt disclosed that it is difficult for her to afford food most months due to a low income. She reports that most of her breakfast and dinner meals are [employer]. She will also receive meals from friends/family, which she adds vegetables to.  Discussed the importance of nutrition, especially with diabetes and other chronic diseases. Mentioned the possibility of a MVI daily to help with intake - RD to provide resources in discharge summary for patient.  Messaged case manager regarding concern for lack of funds to afford food.  Per Epic, pt ate 100% of dinner last night and 100% of breakfast this morning.  Pt's Epic weight appears to be copied from the weight listed in 2019, as her weight on 11/12/2020 was listed as 98 kg in Care Everywhere.   Pt reports her usual body weight is between 200-220 lbs, and she has been this size most of her life with very little change.  RD to order measured weight to determine weight changes.  Recommend adding MVI with minerals and Glucerna shakes TID to promote intake.  Medications:  reviewed; Vitamin D3, folic acid, SSI, Protonix, prednisone, Vitamin B12, Warfarin, NaCl @ 100 ml/hr  Labs: reviewed; CBG 116-178 (H) HbA1c: 7.3% (12/22/2020)  NUTRITION - FOCUSED PHYSICAL EXAM: Flowsheet Row Most Recent Value  Orbital Region No depletion  Upper Arm Region No depletion  Thoracic and Lumbar Region No depletion  Buccal Region No depletion  Temple Region No depletion  Clavicle Bone Region No depletion  Clavicle and Acromion Bone Region No depletion  Scapular Bone Region No depletion  Dorsal Hand No depletion  Patellar Region No depletion  Anterior Thigh Region No depletion  Posterior Calf Region No depletion  Edema (RD Assessment) None  Hair Reviewed  Eyes Reviewed  Mouth Reviewed  Skin Reviewed  Nails Reviewed   Diet Order:   Diet Order             Diet heart healthy/carb modified Room service appropriate? Yes; Fluid consistency: Thin  Diet effective now                  EDUCATION NEEDS:  Education needs have been addressed  Skin:  Skin Assessment: Reviewed RN Assessment  Last BM:  12/22/20 - per pt report  Height:  Ht Readings from Last 1 Encounters:  12/21/20 5\' 5"  (1.651 m)   Weight:  Wt Readings from Last 1 Encounters:  12/21/20 86.2 kg   BMI:  Body mass index is 31.62 kg/m.  Estimated Nutritional Needs:  Kcal:  1900-2100 Protein:  100-115 grams Fluid:  >1.9 L  12/23/20, RD, LDN (she/her/hers) Registered Dietitian I After-Hours/Weekend Pager # in [place]

## 2020-12-23 NOTE — Progress Notes (Signed)
Physical Therapy Treatment Patient Details Name: Cathy Fox MRN: [id number] DOB: July 16, 1955 Today's Date: 12/23/2020   History of Present Illness 65 y.o. female with medical history significant of COPD, diabetes, coagulopathy with antiphospholipid syndrome, obstructive sleep apnea, morbid obesity, depression, GERD, essential hypertension and hyperlipidemia who presents to the ER with shortness of breath cough and hypoxia.  She is not on home O2.  When EMS arrived her oxygen sats was 84% on room air.  She is currently on 2 L of oxygen.  Patient has history of coagulopathy and previous DVTs and PE.  She is on warfarin with INR 1.9.  Patient seen in the ER and evaluated.  She has significant respiratory distress and wheezing.  Also reports some headaches.  Her symptoms have been going on now for 4 days not responding to her home regimen.  She had chest x-rays and CT angiogram checked showing no PE.  Patient appears to be having acute respiratory failure with hypoxia secondary to COPD exacerbation.    PT Comments    Pt was sitting in recliner upon arriving.She is A and O x 4 and agreeable to session. Has been off O2 this morning with sao2 >92%. Remained on rm air without desaturation while ambulating 200 ft. No LOB or safety concern however pt does endorse fatigue. Recommend DC home with HHPT to follow to continue to improve strength, endurence, and safety with ADLs.    Recommendations for follow up therapy are one component of a multi-disciplinary discharge planning process, led by the attending physician.  Recommendations may be updated based on patient status, additional functional criteria and insurance authorization.  Follow Up Recommendations  Home health PT     Equipment Recommendations  None recommended by PT       Precautions / Restrictions Precautions Precautions: Fall Precaution Comments: low fall risk Restrictions Weight Bearing Restrictions: No     Mobility  Bed Mobility   General bed mobility comments: In recliner pre/post session    Transfers Overall transfer level: Needs assistance Equipment used: None Transfers: Sit to/from Stand Sit to Stand: Modified independent (Device/Increase time)   Ambulation/Gait Ambulation/Gait assistance: Modified independent (Device/Increase time) Gait Distance (Feet): 200 Feet Assistive device: None Gait Pattern/deviations: Step-through pattern Gait velocity: WNL   General Gait Details: pt demonstrated safe ability to ambulate on rm air without LOB or safety concern    Balance Overall balance assessment: Needs assistance Sitting-balance support: Feet supported Sitting balance-Leahy Scale: Good     Standing balance support: No upper extremity supported Standing balance-Leahy Scale: Good      Cognition Arousal/Alertness: Awake/alert Behavior During Therapy: WFL for tasks assessed/performed Overall Cognitive Status: Within Functional Limits for tasks assessed    General Comments: pt is A and O x 4             Pertinent Vitals/Pain Pain Assessment: No/denies pain     PT Goals (current goals can now be found in the care plan section) Acute Rehab PT Goals Patient Stated Goal: to get better and go home Progress towards PT goals: Progressing toward goals    Frequency    Min 2X/week      PT Plan Current plan remains appropriate       AM-PAC PT "6 Clicks" Mobility   Outcome Measure  Help needed turning from your back to your side while in a flat bed without using bedrails?: None Help needed moving from lying on your back to sitting on the side of a flat bed without using  bedrails?: None Help needed moving to and from a bed to a chair (including a wheelchair)?: None Help needed standing up from a chair using your arms (e.g., wheelchair or bedside chair)?: None Help needed to walk in hospital room?: A Little Help needed climbing 3-5 steps with a railing? : A Little 6 Click Score: 22    End of  Session Equipment Utilized During Treatment: Gait belt Activity Tolerance: Patient tolerated treatment well Patient left: in chair;with call bell/phone within reach Nurse Communication: Mobility status PT Visit Diagnosis: Unsteadiness on feet (R26.81);Muscle weakness (generalized) (M62.81)     Time: 1120-1130 PT Time Calculation (min) (ACUTE ONLY): 10 min  Charges:  $Gait Training: 8-22 mins                     Jetta Lout PTA 12/23/20, 3:42 PM

## 2020-12-23 NOTE — TOC Initial Note (Signed)
Transition of Care [facility]) - Initial/Assessment Note    Patient Details  Name: Cathy Fox MRN: [id number] Date of Birth: [birth year]  [facility]) CM/SW Contact:    Caryn Section, RN Phone Number: 12/23/2020, 9:31 AM  Clinical Narrative:      Patient lives at home with son, who works during the day.  She states she can ambulate fine, but feels she needs some assistance at home.  Patient then became teary-eyed and stated she cannot afford food, and is concerned about getting a glucometer.  Glucometer provided via [facility] with [facility].  Meals on Wheels consulted and [employer] consulted for food needs.  [facility] card given to patient as well as lists of community resources.    Patient states for RNCM to return to discuss further.  Jason from [employer] notified for [facility], no DME recommended at this time.  TOC to follow             Expected Discharge Plan: Home w Home Health Services Barriers to Discharge: Continued Medical Work up   Patient Goals and CMS Choice Patient states their goals for this hospitalization and ongoing recovery are:: To go home and get some resources      Expected Discharge Plan and Services Expected Discharge Plan: Home w Home Health Services In-house Referral: Chaplain Discharge Planning Services: CM Consult Post Acute Care Choice: Home Health Living arrangements for the past 2 months: Single Family Home                 DME Arranged:  (No DME recommended at this time)         HH Arranged: PT, OT HH Agency: [facility] Date HH Agency Contacted: 12/23/20 Time HH Agency Contacted: 0930 Representative spoke with at [facility] Agency: Barbara Cower  Prior Living Arrangements/Services Living arrangements for the past 2 months: Single Family Home Lives with:: Self, Adult Children Patient language and need for interpreter reviewed:: Yes (No interpreter required) Do you feel safe going back to the  place where you live?: Yes      Need for Family Participation in Patient Care: Yes (Comment) Care giver support system in place?: Yes (comment) Current home services:  (No current home services) Criminal Activity/Legal Involvement Pertinent to Current Situation/Hospitalization: No - Comment as needed  Activities of Daily Living Home Assistive Devices/Equipment: None ADL Screening (condition at time of admission) Patient's cognitive ability adequate to safely complete daily activities?: Yes Is the patient deaf or have difficulty hearing?: No Does the patient have difficulty seeing, even when wearing glasses/contacts?: No Does the patient have difficulty concentrating, remembering, or making decisions?: No Patient able to express need for assistance with ADLs?: No Does the patient have difficulty dressing or bathing?: No Independently performs ADLs?: Yes (appropriate for developmental age) Does the patient have difficulty walking or climbing stairs?: Yes Weakness of Legs: None Weakness of Arms/Hands: None  Permission Sought/Granted Permission sought to share information with : Other (comment) Permission granted to share information with : Yes, Release of Information Signed     Permission granted to share info w AGENCY: Prospective HH agencies        Emotional Assessment Appearance:: Appears stated age Attitude/Demeanor/Rapport: Gracious Affect (typically observed): Appropriate Orientation: : Oriented to Self, Oriented to Place, Oriented to  Time, Oriented to Situation Alcohol / Substance Use: Not Applicable Psych Involvement: No (comment)  Admission diagnosis:  COPD exacerbation (HCC) [J44.1] Acute respiratory failure with hypoxia (  HCC) [J96.01] Community acquired pneumonia, unspecified laterality [J18.9] Patient Active Problem List   Diagnosis Date Noted   COPD exacerbation (HCC) 12/21/2020   Hypomagnesemia 12/21/2020   Diabetes mellitus type 2, insulin dependent (HCC)  10/13/2016   Hyponatremia 09/19/2016   Hypokalemia 09/19/2016   Leukocytosis 09/19/2016   Elevated troponin 09/19/2016   Klebsiella cystitis 09/19/2016   Generalized weakness 09/19/2016   Tobacco abuse counseling 09/19/2016   Migraine headache 09/19/2016   Acute respiratory failure (HCC) 09/16/2016   Pneumonia 01/05/2016   Antiphospholipid antibody syndrome (HCC) 02/21/2012   PCP:  Warren Lacy, MD Pharmacy:   [phone] - [place],  - [address] [place] [address] Phone: [phone] Fax: [phone]     Social Determinants of Health (SDOH) Interventions    Readmission Risk Interventions No flowsheet data found.

## 2020-12-23 NOTE — Progress Notes (Signed)
ANTICOAGULATION CONSULT NOTE - Initial Consult  Pharmacy Consult for Warfarin  Indication: Antiphospholipid antibody syndrome  Allergies  Allergen Reactions   Hydrocodone Nausea Only    Patient Measurements: Height: 5\' 5"  (165.1 cm) Weight: 86.2 kg (190 lb 0.6 oz) IBW/kg (Calculated) : 57 Heparin Dosing Weight:   Vital Signs: Temp: 97.7 F (36.5 C) (09/28 1147) Temp Source: Oral (09/28 1147) BP: 125/68 (09/28 1147) Pulse Rate: 65 (09/28 1147)  Labs: Recent Labs    12/21/20 1546 12/21/20 2047 12/22/20 0650 12/23/20 0442  HGB 13.4  --  12.6 12.6  HCT 39.4  --  36.2 39.2  PLT 188  --  182 174  LABPROT 22.2*  --  22.5* 26.6*  INR 1.9*  --  2.0* 2.5*  CREATININE 0.71  --  0.61 0.57  TROPONINIHS 5 4  --   --      Estimated Creatinine Clearance: 76 mL/min (by C-G formula based on SCr of 0.57 mg/dL).   Medical History: Past Medical History:  Diagnosis Date   Anemia    Anxiety    Arthritis    DDD (degenerative disc disease), cervical    Depression    Diabetes mellitus without complication (HCC)    DVT (deep venous thrombosis) (HCC)    GERD (gastroesophageal reflux disease)    Headache    History of pulmonary embolus (PE) 2005   Hx of antiphospholipid antibody syndrome    Hypotension    Pancreatitis 2015   [facility] [place]   Shortness of breath dyspnea    Sleep apnea    Vertigo     Medications:  Medications Prior to Admission  Medication Sig Dispense Refill Last Dose   atorvastatin (LIPITOR) 80 MG tablet Take 80 mg by mouth daily.   12/20/2020 at 2000   busPIRone (BUSPAR) 10 MG tablet Take 20 mg by mouth 3 (three) times daily.   12/20/2020 at 0800   Cholecalciferol 50 MCG (2000 UT) TABS Take 2,000 Units by mouth daily.   12/20/2020 at 0800   cyanocobalamin 1000 MCG tablet Take 1,000 mcg by mouth daily.   12/20/2020 at 0800   folic acid (FOLVITE) 1 MG tablet Take 1 mg by mouth 2 (two) times daily.    12/20/2020 at 0800   furosemide (LASIX) 20 MG tablet Take  20-40 mg by mouth See admin instructions. Take 2 tablets (40 mg) by mouth every morning and Take 20 mg by mouth in the evening.   12/20/2020 at 0800   hydroxychloroquine (PLAQUENIL) 200 MG tablet Take 200 mg by mouth daily.   12/20/2020 at 0800   metFORMIN (GLUCOPHAGE) 500 MG tablet Take 500 mg by mouth every morning.   12/20/2020 at 0800   methocarbamol (ROBAXIN) 500 MG tablet Take 500 mg by mouth 2 (two) times daily.   12/20/2020 at 0800   metoprolol tartrate (LOPRESSOR) 25 MG tablet Take 25 mg by mouth 2 (two) times daily.   12/20/2020 at 0800   omeprazole (PRILOSEC) 40 MG capsule Take 40 mg by mouth 2 (two) times daily.   12/20/2020 at 0800   oxyCODONE-acetaminophen (PERCOCET) 10-325 MG tablet Take 1 tablet by mouth every 8 (eight) hours as needed for pain.   12/20/2020 at 0800   pregabalin (LYRICA) 150 MG capsule Take 150 mg by mouth 2 (two) times daily.   12/20/2020 at 1400   pregabalin (LYRICA) 75 MG capsule Take 75 mg by mouth daily.   12/20/2020 at 0800   sertraline (ZOLOFT) 100 MG tablet Take 100 mg  by mouth 2 (two) times daily.   12/20/2020 at 0800   SUMAtriptan (IMITREX) 100 MG tablet Take 100 mg by mouth as directed.   12/20/2020 at 0800   warfarin (COUMADIN) 10 MG tablet Take 5-10 mg by mouth See admin instructions. Take5 mg tablet by mouth every evening on Monday, Wednesday, Saturday. Take 10 mg tablet by mouth on Tuesday, Thursday, Friday and Sunday   12/20/2020 at 1600   [DISCONTINUED] predniSONE (DELTASONE) 20 MG tablet Take 40 mg by mouth daily.   12/20/2020 at 0800   albuterol (PROVENTIL) (2.5 MG/3ML) 0.083% nebulizer solution Take 3 mLs (2.5 mg total) by nebulization every 6 (six) hours as needed for wheezing or shortness of breath. (Patient not taking: No sig reported) 180 vial 12 Not Taking   alendronate (FOSAMAX) 70 MG tablet Take 70 mg by mouth once a week. Wednesday   12/16/2020   methotrexate 50 MG/2ML injection Inject 0.8 mLs into the skin once a week. Use on Thursday   12/17/2020    ondansetron (ZOFRAN) 4 MG tablet Take 4 mg by mouth daily as needed for nausea.   unknown at prn   PROAIR HFA 108 (90 Base) MCG/ACT inhaler INHALE 2 PUFFS PO Q 6 TO 8 H PRN FOR WHEEZING  10 unknown at prn   [DISCONTINUED] benzonatate (TESSALON) 100 MG capsule Take 100 mg by mouth 3 (three) times daily as needed for cough.   unknown at prn   [DISCONTINUED] budesonide-formoterol (SYMBICORT) 160-4.5 MCG/ACT inhaler Inhale 2 puffs into the lungs 2 (two) times daily. (Patient not taking: No sig reported) 1 Inhaler 12 Not Taking    Assessment: Pharmacy consulted to dose warfarin in this 65 year old female with antiphospholipid antibody syndrome.   Pt was on warfarin 5 mg PO every Mon, Wed and Sat   and warfarin 10 mg PO every Tues, Thurs, Frid and Sun. INR therapeutic but new Ddi with antibiotics noted.  9/26 INR @ 1546:  1.9  ;warfarin 2.5mg  given on admission 9/27 INR @ 0746:  2.0 ; 10mg  9/28 INR @ 0442: 2.5  Goal of Therapy:  INR 2-3   Plan:  Will decrease dose to 2.5mg  today(home dose is 5mg  for today) Will recheck INR daily x 3 days and adjust warfarin as needed  10/28, PharmD Clinical Pharmacist 12/23/2020 2:45 PM

## 2020-12-23 NOTE — Progress Notes (Signed)
Patient discharged to home today.Both IV removed, patient belongings sent home. All questions answered. Patient education given to patient and son.

## 2020-12-26 LAB — CULTURE, BLOOD (ROUTINE X 2)
Culture: NO GROWTH
Culture: NO GROWTH
Special Requests: ADEQUATE
Special Requests: ADEQUATE

## 2021-01-20 ENCOUNTER — Encounter (HOSPITAL_COMMUNITY): Payer: Self-pay | Admitting: Radiology

## 2021-03-12 ENCOUNTER — Telehealth: Payer: Self-pay

## 2021-03-12 NOTE — Telephone Encounter (Signed)
I left a message on patient's voicemail to return my call. Dr. Moye will be out of the office next week and we need to reschedule her appointment.  °

## 2021-03-16 ENCOUNTER — Ambulatory Visit: Payer: Medicare Other | Admitting: Dermatology

## 2021-03-18 ENCOUNTER — Ambulatory Visit: Payer: Medicare Other | Admitting: Dermatology

## 2021-05-05 ENCOUNTER — Ambulatory Visit: Payer: Medicare Other | Admitting: Dermatology

## 2021-10-09 IMAGING — CR DG CHEST 2V
1 series · 2 of 2 positions shown · non-contrast
Comparison: 05/23/2017

CLINICAL DATA: Shortness of breath.

EXAM:
CHEST - 2 VIEW

[Series 1: dg chest 2 view · 0.14mm/px · 2 of 2 slices shown]
[im 1/2]
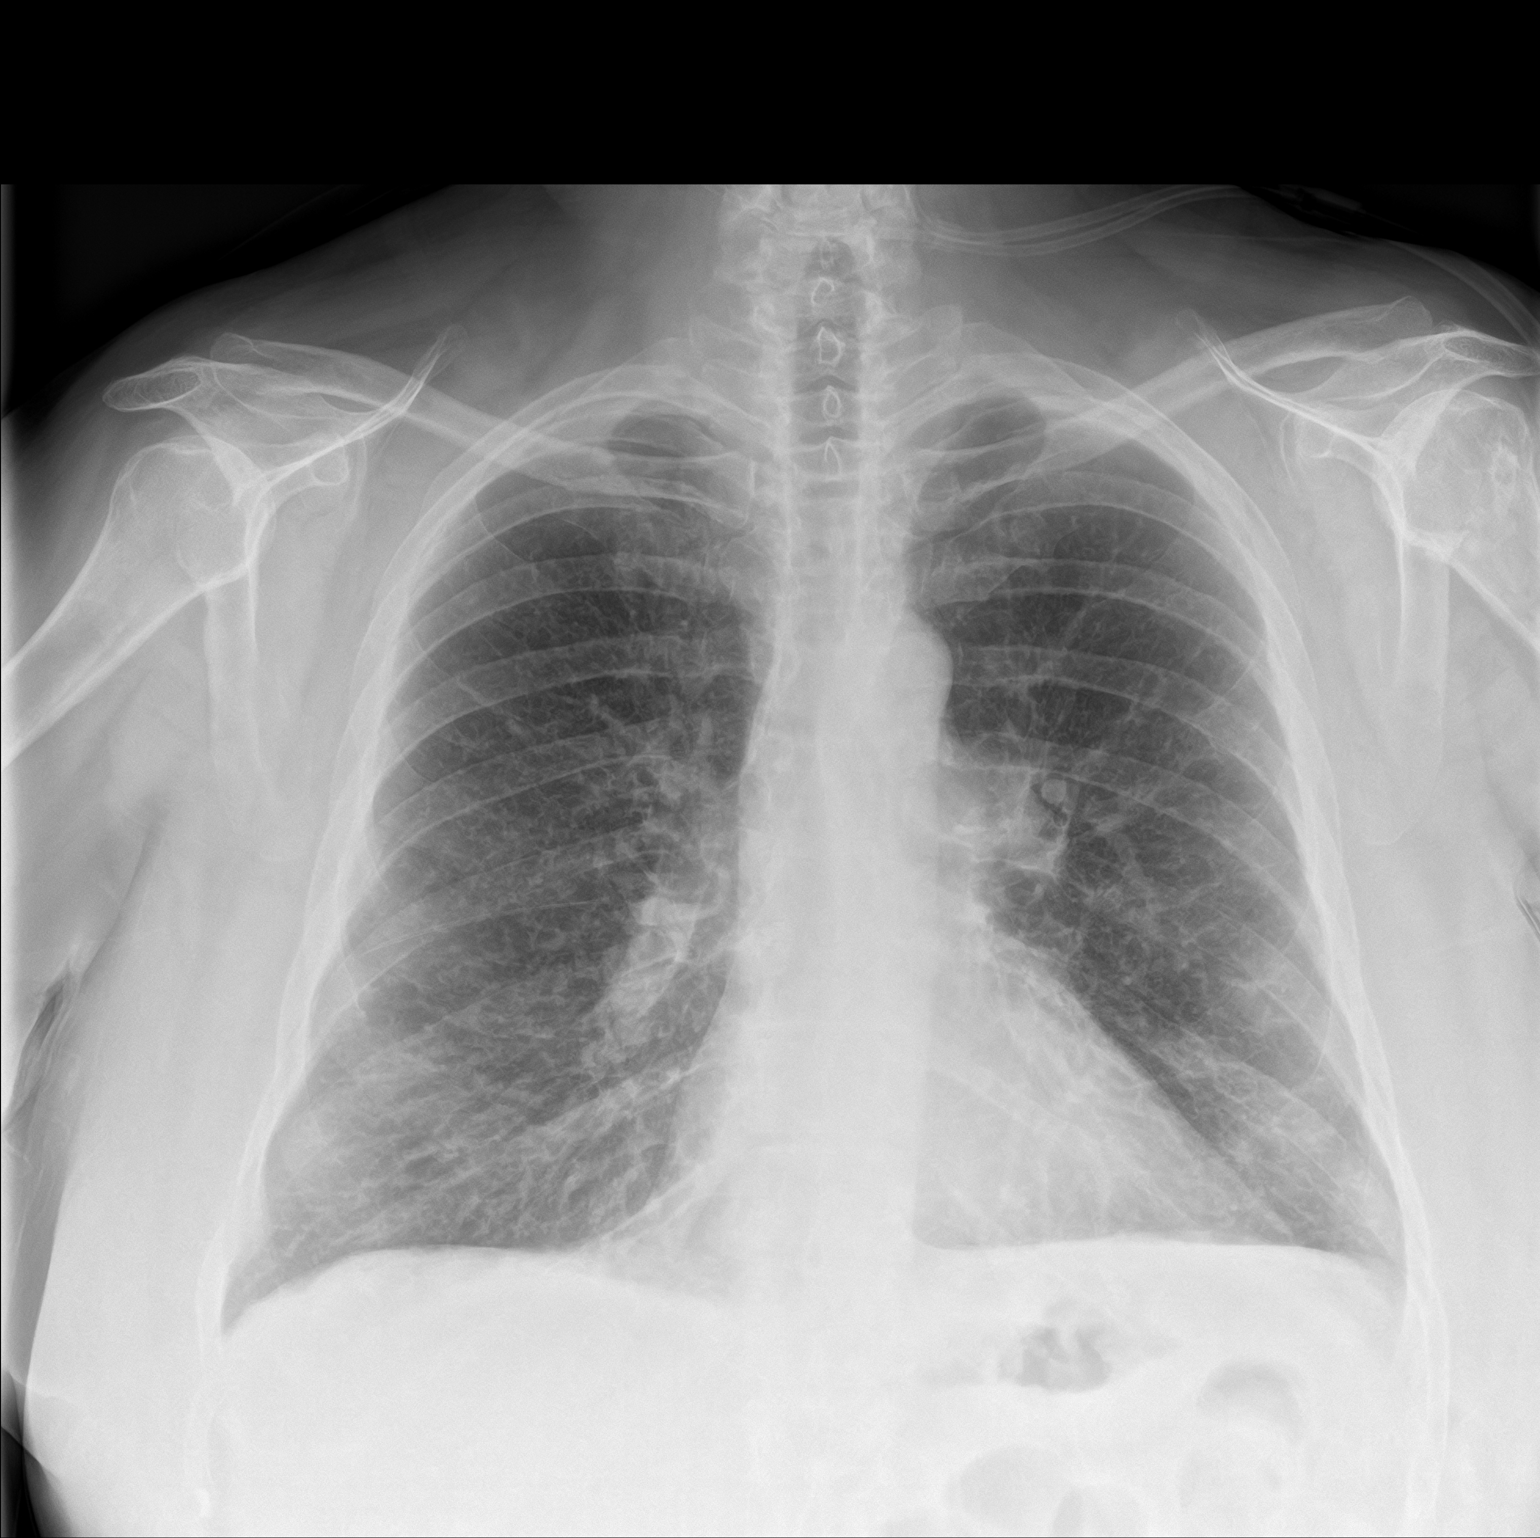
[im 2/2]
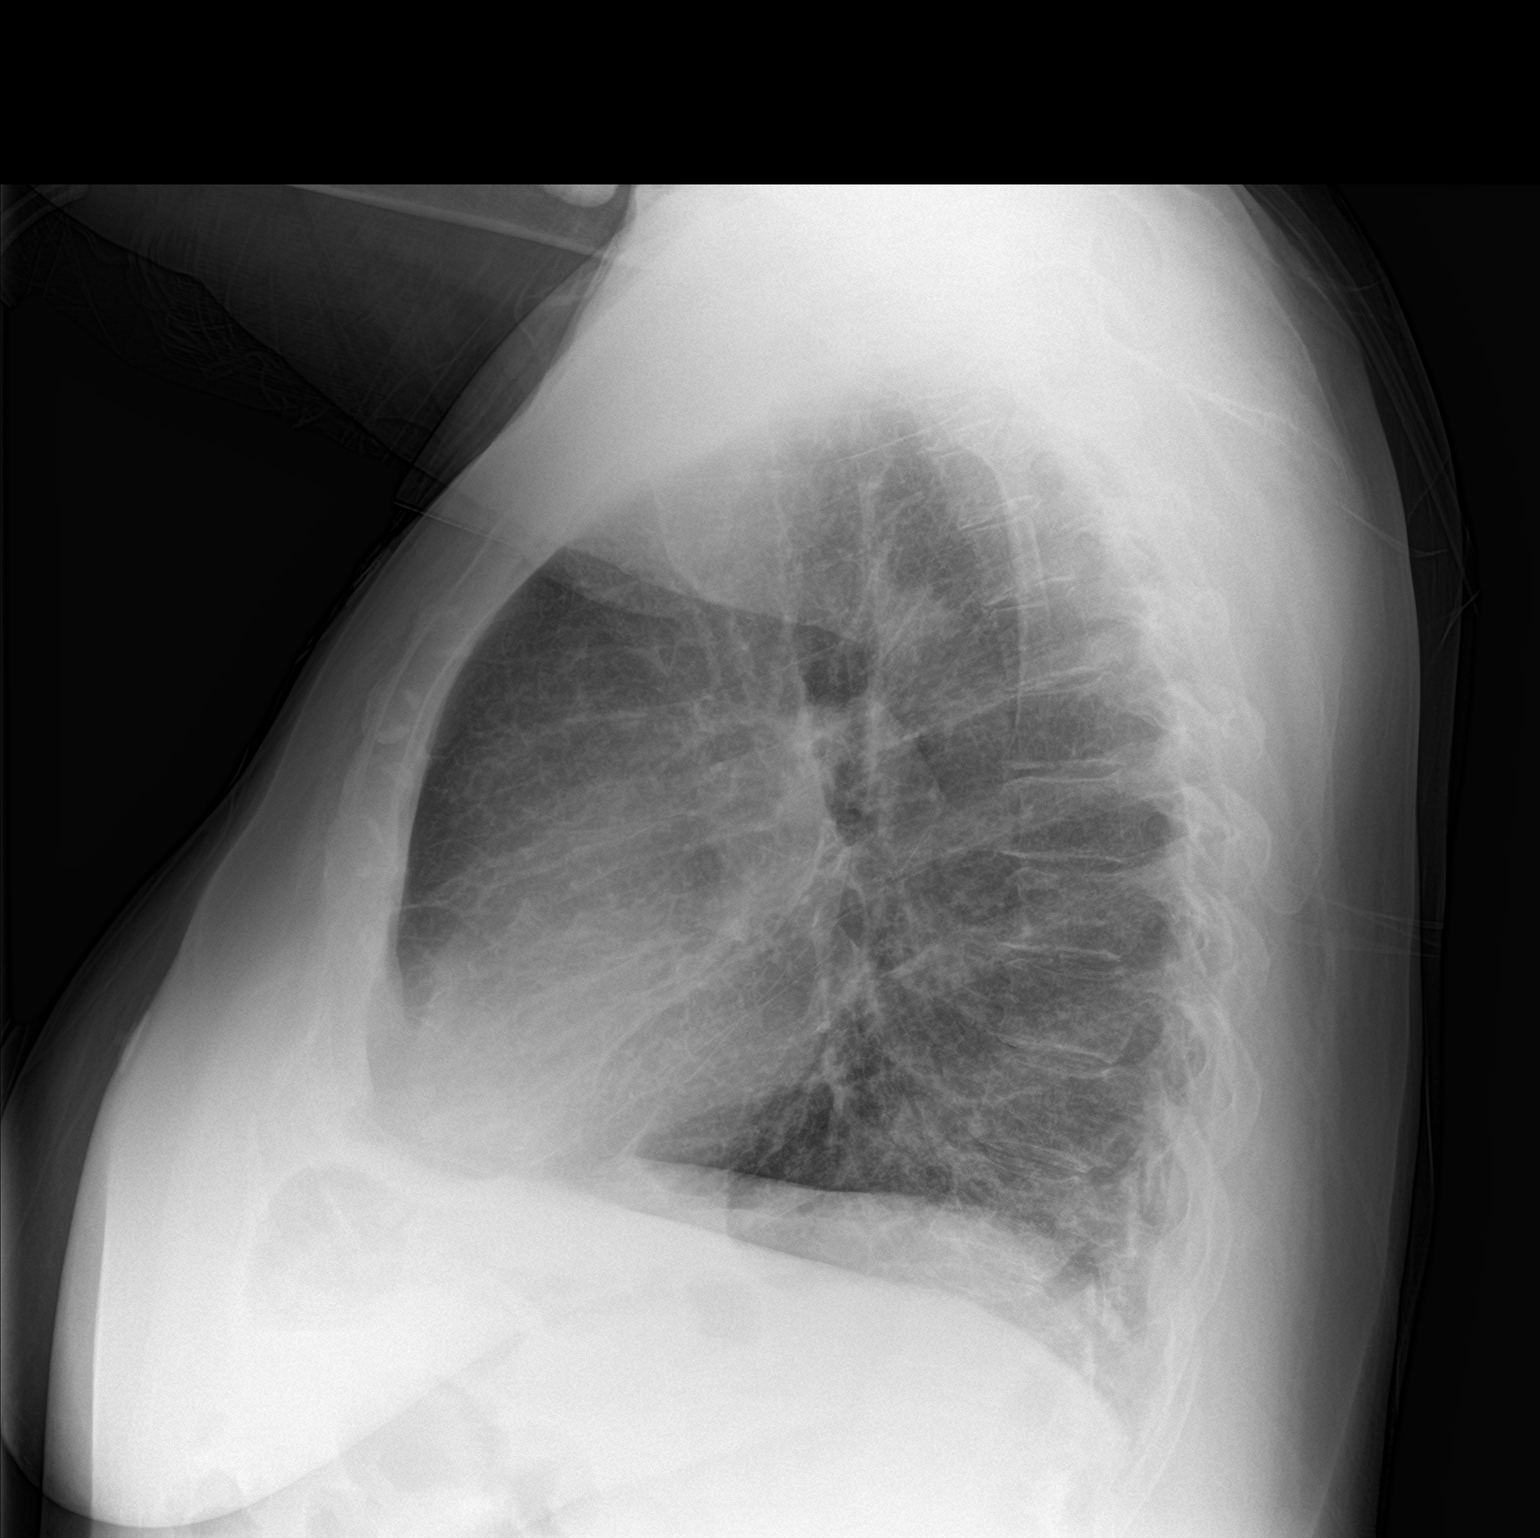

[2 of 2 positions shown; findings below may reference images not displayed]

FINDINGS: Coarse lung markings in the lower lungs appear to be chronic. No
large areas of airspace disease or consolidation. Heart size is
normal. The trachea is midline. Chronic sclerosis and probable
posttraumatic changes in the proximal left humerus.
IMPRESSION: Chronic changes without acute findings.
